# Patient Record
Sex: Female | Born: 1950 | Race: White | Hispanic: No | Smoking: Former smoker
Health system: Southern US, Community
[De-identification: ages and names within clinical notes are randomized; demographics above are authoritative.]

## PROBLEM LIST (undated history)

## (undated) DIAGNOSIS — K7581 Nonalcoholic steatohepatitis (NASH): Secondary | ICD-10-CM

## (undated) DIAGNOSIS — E079 Disorder of thyroid, unspecified: Secondary | ICD-10-CM

## (undated) DIAGNOSIS — K7460 Unspecified cirrhosis of liver: Secondary | ICD-10-CM

## (undated) DIAGNOSIS — C50919 Malignant neoplasm of unspecified site of unspecified female breast: Secondary | ICD-10-CM

## (undated) DIAGNOSIS — K760 Fatty (change of) liver, not elsewhere classified: Secondary | ICD-10-CM

## (undated) DIAGNOSIS — I8500 Esophageal varices without bleeding: Secondary | ICD-10-CM

## (undated) DIAGNOSIS — I10 Essential (primary) hypertension: Secondary | ICD-10-CM

## (undated) DIAGNOSIS — E039 Hypothyroidism, unspecified: Secondary | ICD-10-CM

## (undated) DIAGNOSIS — F419 Anxiety disorder, unspecified: Secondary | ICD-10-CM

## (undated) DIAGNOSIS — E7800 Pure hypercholesterolemia, unspecified: Secondary | ICD-10-CM

## (undated) HISTORY — DX: Essential (primary) hypertension: I10

## (undated) HISTORY — DX: Disorder of thyroid, unspecified: E07.9

## (undated) HISTORY — DX: Nonalcoholic steatohepatitis (NASH): K75.81

## (undated) HISTORY — PX: CATARACT EXTRACTION: SUR2

## (undated) HISTORY — DX: Unspecified cirrhosis of liver: K74.60

## (undated) HISTORY — PX: ESOPHAGOGASTRODUODENOSCOPY (EGD) WITH ESOPHAGEAL DILATION: SHX5812

## (undated) HISTORY — DX: Esophageal varices without bleeding: I85.00

## (undated) HISTORY — DX: Anxiety disorder, unspecified: F41.9

## (undated) HISTORY — PX: APPENDECTOMY: SHX54

## (undated) HISTORY — DX: Pure hypercholesterolemia, unspecified: E78.00

## (undated) HISTORY — PX: COLONOSCOPY: SHX174

## (undated) HISTORY — PX: BREAST LUMPECTOMY: SHX2

## (undated) HISTORY — DX: Malignant neoplasm of unspecified site of unspecified female breast: C50.919

---

## 2002-12-13 DIAGNOSIS — C50919 Malignant neoplasm of unspecified site of unspecified female breast: Secondary | ICD-10-CM

## 2002-12-13 HISTORY — PX: AUGMENTATION MAMMAPLASTY: SUR837

## 2002-12-13 HISTORY — PX: ABDOMINOPLASTY: SUR9

## 2002-12-13 HISTORY — DX: Malignant neoplasm of unspecified site of unspecified female breast: C50.919

## 2003-05-31 ENCOUNTER — Encounter (INDEPENDENT_AMBULATORY_CARE_PROVIDER_SITE_OTHER): Payer: Self-pay | Admitting: *Deleted

## 2003-05-31 ENCOUNTER — Encounter: Admission: RE | Admit: 2003-05-31 | Discharge: 2003-05-31 | Payer: Self-pay | Admitting: General Surgery

## 2003-05-31 ENCOUNTER — Encounter: Payer: Self-pay | Admitting: General Surgery

## 2003-05-31 ENCOUNTER — Other Ambulatory Visit: Admission: RE | Admit: 2003-05-31 | Discharge: 2003-05-31 | Payer: Self-pay | Admitting: Diagnostic Radiology

## 2003-06-05 ENCOUNTER — Ambulatory Visit (HOSPITAL_COMMUNITY): Admission: RE | Admit: 2003-06-05 | Discharge: 2003-06-05 | Payer: Self-pay | Admitting: General Surgery

## 2003-06-05 ENCOUNTER — Encounter: Payer: Self-pay | Admitting: General Surgery

## 2003-06-06 ENCOUNTER — Encounter: Payer: Self-pay | Admitting: General Surgery

## 2003-07-01 ENCOUNTER — Encounter: Payer: Self-pay | Admitting: General Surgery

## 2003-07-01 ENCOUNTER — Ambulatory Visit (HOSPITAL_BASED_OUTPATIENT_CLINIC_OR_DEPARTMENT_OTHER): Admission: RE | Admit: 2003-07-01 | Discharge: 2003-07-01 | Payer: Self-pay | Admitting: General Surgery

## 2003-07-01 ENCOUNTER — Encounter (INDEPENDENT_AMBULATORY_CARE_PROVIDER_SITE_OTHER): Payer: Self-pay | Admitting: *Deleted

## 2003-07-15 ENCOUNTER — Encounter (INDEPENDENT_AMBULATORY_CARE_PROVIDER_SITE_OTHER): Payer: Self-pay | Admitting: *Deleted

## 2003-07-15 ENCOUNTER — Ambulatory Visit (HOSPITAL_BASED_OUTPATIENT_CLINIC_OR_DEPARTMENT_OTHER): Admission: RE | Admit: 2003-07-15 | Discharge: 2003-07-15 | Payer: Self-pay | Admitting: General Surgery

## 2003-08-13 ENCOUNTER — Encounter: Payer: Self-pay | Admitting: General Surgery

## 2003-08-13 ENCOUNTER — Encounter (INDEPENDENT_AMBULATORY_CARE_PROVIDER_SITE_OTHER): Payer: Self-pay | Admitting: Specialist

## 2003-08-13 ENCOUNTER — Inpatient Hospital Stay (HOSPITAL_COMMUNITY): Admission: RE | Admit: 2003-08-13 | Discharge: 2003-08-15 | Payer: Self-pay | Admitting: General Surgery

## 2003-10-30 ENCOUNTER — Ambulatory Visit (HOSPITAL_BASED_OUTPATIENT_CLINIC_OR_DEPARTMENT_OTHER): Admission: RE | Admit: 2003-10-30 | Discharge: 2003-10-30 | Payer: Self-pay | Admitting: Plastic Surgery

## 2004-07-10 ENCOUNTER — Encounter (INDEPENDENT_AMBULATORY_CARE_PROVIDER_SITE_OTHER): Payer: Self-pay | Admitting: Specialist

## 2004-07-10 ENCOUNTER — Ambulatory Visit (HOSPITAL_COMMUNITY): Admission: RE | Admit: 2004-07-10 | Discharge: 2004-07-10 | Payer: Self-pay | Admitting: *Deleted

## 2004-09-08 ENCOUNTER — Other Ambulatory Visit: Admission: RE | Admit: 2004-09-08 | Discharge: 2004-09-08 | Payer: Self-pay | Admitting: Obstetrics and Gynecology

## 2004-12-03 ENCOUNTER — Ambulatory Visit (HOSPITAL_BASED_OUTPATIENT_CLINIC_OR_DEPARTMENT_OTHER): Admission: RE | Admit: 2004-12-03 | Discharge: 2004-12-03 | Payer: Self-pay | Admitting: Plastic Surgery

## 2004-12-03 ENCOUNTER — Ambulatory Visit (HOSPITAL_COMMUNITY): Admission: RE | Admit: 2004-12-03 | Discharge: 2004-12-03 | Payer: Self-pay | Admitting: Plastic Surgery

## 2005-03-08 ENCOUNTER — Ambulatory Visit: Payer: Self-pay | Admitting: Oncology

## 2005-06-22 ENCOUNTER — Encounter: Admission: RE | Admit: 2005-06-22 | Discharge: 2005-06-22 | Payer: Self-pay | Admitting: Oncology

## 2005-06-23 ENCOUNTER — Ambulatory Visit (HOSPITAL_COMMUNITY): Admission: RE | Admit: 2005-06-23 | Discharge: 2005-06-23 | Payer: Self-pay | Admitting: Oncology

## 2005-10-28 ENCOUNTER — Other Ambulatory Visit: Admission: RE | Admit: 2005-10-28 | Discharge: 2005-10-28 | Payer: Self-pay | Admitting: Obstetrics and Gynecology

## 2005-11-02 ENCOUNTER — Encounter: Admission: RE | Admit: 2005-11-02 | Discharge: 2005-11-02 | Payer: Self-pay | Admitting: Obstetrics and Gynecology

## 2006-03-04 ENCOUNTER — Ambulatory Visit: Payer: Self-pay | Admitting: Oncology

## 2006-06-23 ENCOUNTER — Encounter: Admission: RE | Admit: 2006-06-23 | Discharge: 2006-06-23 | Payer: Self-pay | Admitting: Oncology

## 2006-08-17 ENCOUNTER — Encounter (INDEPENDENT_AMBULATORY_CARE_PROVIDER_SITE_OTHER): Payer: Self-pay | Admitting: Specialist

## 2006-08-17 ENCOUNTER — Ambulatory Visit (HOSPITAL_BASED_OUTPATIENT_CLINIC_OR_DEPARTMENT_OTHER): Admission: RE | Admit: 2006-08-17 | Discharge: 2006-08-17 | Payer: Self-pay | Admitting: Plastic Surgery

## 2007-01-09 ENCOUNTER — Other Ambulatory Visit: Admission: RE | Admit: 2007-01-09 | Discharge: 2007-01-09 | Payer: Self-pay | Admitting: Obstetrics and Gynecology

## 2007-03-08 ENCOUNTER — Ambulatory Visit: Payer: Self-pay | Admitting: Oncology

## 2007-03-13 LAB — CBC WITH DIFFERENTIAL/PLATELET
BASO%: 0.1 % (ref 0.0–2.0)
Basophils Absolute: 0 10*3/uL (ref 0.0–0.1)
EOS%: 1.5 % (ref 0.0–7.0)
Eosinophils Absolute: 0.1 10*3/uL (ref 0.0–0.5)
HCT: 36.3 % (ref 34.8–46.6)
HGB: 12.8 g/dL (ref 11.6–15.9)
LYMPH%: 33.6 % (ref 14.0–48.0)
MCH: 32.9 pg (ref 26.0–34.0)
MCHC: 35.3 g/dL (ref 32.0–36.0)
MCV: 93.4 fL (ref 81.0–101.0)
MONO#: 0.3 10*3/uL (ref 0.1–0.9)
MONO%: 4.3 % (ref 0.0–13.0)
NEUT#: 3.5 10*3/uL (ref 1.5–6.5)
NEUT%: 60.5 % (ref 39.6–76.8)
Platelets: 244 10*3/uL (ref 145–400)
RBC: 3.89 10*6/uL (ref 3.70–5.32)
RDW: 13.4 % (ref 11.3–14.5)
WBC: 5.8 10*3/uL (ref 3.9–10.0)
lymph#: 1.9 10*3/uL (ref 0.9–3.3)

## 2007-03-13 LAB — COMPREHENSIVE METABOLIC PANEL
ALT: 85 U/L — ABNORMAL HIGH (ref 0–35)
AST: 142 U/L — ABNORMAL HIGH (ref 0–37)
Albumin: 4.5 g/dL (ref 3.5–5.2)
Alkaline Phosphatase: 122 U/L — ABNORMAL HIGH (ref 39–117)
BUN: 10 mg/dL (ref 6–23)
CO2: 23 mEq/L (ref 19–32)
Calcium: 10.2 mg/dL (ref 8.4–10.5)
Chloride: 103 mEq/L (ref 96–112)
Creatinine, Ser: 0.76 mg/dL (ref 0.40–1.20)
Glucose, Bld: 91 mg/dL (ref 70–99)
Potassium: 4 mEq/L (ref 3.5–5.3)
Sodium: 140 mEq/L (ref 135–145)
Total Bilirubin: 0.4 mg/dL (ref 0.3–1.2)
Total Protein: 8.5 g/dL — ABNORMAL HIGH (ref 6.0–8.3)

## 2007-03-13 LAB — CANCER ANTIGEN 27.29: CA 27.29: 26 U/mL (ref 0–39)

## 2007-03-24 ENCOUNTER — Encounter: Admission: RE | Admit: 2007-03-24 | Discharge: 2007-03-24 | Payer: Self-pay | Admitting: Gastroenterology

## 2007-04-07 ENCOUNTER — Encounter (INDEPENDENT_AMBULATORY_CARE_PROVIDER_SITE_OTHER): Payer: Self-pay | Admitting: Specialist

## 2007-04-07 ENCOUNTER — Ambulatory Visit (HOSPITAL_BASED_OUTPATIENT_CLINIC_OR_DEPARTMENT_OTHER): Admission: RE | Admit: 2007-04-07 | Discharge: 2007-04-07 | Payer: Self-pay | Admitting: Obstetrics & Gynecology

## 2007-04-28 ENCOUNTER — Encounter (INDEPENDENT_AMBULATORY_CARE_PROVIDER_SITE_OTHER): Payer: Self-pay | Admitting: Diagnostic Radiology

## 2007-04-28 ENCOUNTER — Ambulatory Visit (HOSPITAL_COMMUNITY): Admission: RE | Admit: 2007-04-28 | Discharge: 2007-04-28 | Payer: Self-pay | Admitting: Gastroenterology

## 2007-07-03 ENCOUNTER — Encounter: Admission: RE | Admit: 2007-07-03 | Discharge: 2007-07-03 | Payer: Self-pay | Admitting: Oncology

## 2008-02-21 ENCOUNTER — Other Ambulatory Visit: Admission: RE | Admit: 2008-02-21 | Discharge: 2008-02-21 | Payer: Self-pay | Admitting: Obstetrics and Gynecology

## 2008-03-07 ENCOUNTER — Ambulatory Visit: Payer: Self-pay | Admitting: Oncology

## 2008-03-11 LAB — CBC WITH DIFFERENTIAL/PLATELET
BASO%: 0.8 % (ref 0.0–2.0)
Basophils Absolute: 0.1 10*3/uL (ref 0.0–0.1)
EOS%: 0.4 % (ref 0.0–7.0)
Eosinophils Absolute: 0 10*3/uL (ref 0.0–0.5)
HCT: 35.3 % (ref 34.8–46.6)
HGB: 12.5 g/dL (ref 11.6–15.9)
LYMPH%: 25.4 % (ref 14.0–48.0)
MCH: 32.4 pg (ref 26.0–34.0)
MCHC: 35.5 g/dL (ref 32.0–36.0)
MCV: 91.3 fL (ref 81.0–101.0)
MONO#: 0.3 10*3/uL (ref 0.1–0.9)
MONO%: 3.7 % (ref 0.0–13.0)
NEUT#: 5.2 10*3/uL (ref 1.5–6.5)
NEUT%: 69.7 % (ref 39.6–76.8)
Platelets: 233 10*3/uL (ref 145–400)
RBC: 3.86 10*6/uL (ref 3.70–5.32)
RDW: 12.9 % (ref 11.3–14.5)
WBC: 7.4 10*3/uL (ref 3.9–10.0)
lymph#: 1.9 10*3/uL (ref 0.9–3.3)

## 2008-03-13 LAB — IMMUNOFIXATION ELECTROPHORESIS
IgA: 318 mg/dL (ref 68–378)
IgG (Immunoglobin G), Serum: 1990 mg/dL — ABNORMAL HIGH (ref 694–1618)
IgM, Serum: 239 mg/dL (ref 60–263)
Total Protein, Serum Electrophoresis: 8.4 g/dL — ABNORMAL HIGH (ref 6.0–8.3)

## 2008-03-13 LAB — COMPREHENSIVE METABOLIC PANEL
ALT: 69 U/L — ABNORMAL HIGH (ref 0–35)
AST: 94 U/L — ABNORMAL HIGH (ref 0–37)
Albumin: 4.7 g/dL (ref 3.5–5.2)
Alkaline Phosphatase: 116 U/L (ref 39–117)
BUN: 11 mg/dL (ref 6–23)
CO2: 25 mEq/L (ref 19–32)
Calcium: 10 mg/dL (ref 8.4–10.5)
Chloride: 101 mEq/L (ref 96–112)
Creatinine, Ser: 0.82 mg/dL (ref 0.40–1.20)
Glucose, Bld: 102 mg/dL — ABNORMAL HIGH (ref 70–99)
Potassium: 4 mEq/L (ref 3.5–5.3)
Sodium: 138 mEq/L (ref 135–145)
Total Bilirubin: 0.6 mg/dL (ref 0.3–1.2)
Total Protein: 8.4 g/dL — ABNORMAL HIGH (ref 6.0–8.3)

## 2008-03-13 LAB — FOLLICLE STIMULATING HORMONE: FSH: 15.2 m[IU]/mL

## 2008-03-13 LAB — CANCER ANTIGEN 27.29: CA 27.29: 20 U/mL (ref 0–39)

## 2008-03-25 LAB — ESTRADIOL, ULTRA SENS: Estradiol, Ultra Sensitive: 10 pg/mL

## 2008-07-03 ENCOUNTER — Encounter: Admission: RE | Admit: 2008-07-03 | Discharge: 2008-07-03 | Payer: Self-pay | Admitting: Oncology

## 2008-07-19 ENCOUNTER — Ambulatory Visit: Payer: Self-pay | Admitting: Oncology

## 2010-12-13 HISTORY — PX: MASTECTOMY, PARTIAL: SHX709

## 2011-01-03 ENCOUNTER — Encounter: Payer: Self-pay | Admitting: Oncology

## 2011-04-30 NOTE — Op Note (Signed)
   NAME:  Joyce Griffin, Joyce Griffin                            ACCOUNT NO.:  [id number]   MEDICAL RECORD NO.:  [id number]                   PATIENT TYPE:  AMB   LOCATION:  DSC                                  FACILITY:  [facility]   PHYSICIAN:  Rose Phi. Maple Hudson, M.D.                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   OPERATION:  Re-excision of previous lumpectomy site.   SURGEON:  Rose Phi. Maple Hudson, M.D.   ANESTHESIA:  General.   OPERATIVE PROCEDURE:  The patient had previously undergone a left partial  mastectomy and sentinel node biopsy for a T1, N0 lesion.  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  For that reason we  elected to re-excise this.   After suitable general anesthesia was induced, the patient was placed in a  supine position with the left arm extended on the arm board and the breast  prepped and draped in the usual fashion.  I then opened the previous  incision and entered the lumpectomy cavity.   After evacuating it, I then excised circumferentially beginning on the  inferior margin at about the 4 o'clock position and excised the margin  circumferentially, going from lateral to medial, and then the superior  margin to about 2 o'clock.  That way we excised the superior, medial, and  inferior margins.  These were then oriented for the pathologist and  methylene blue was placed on the outside margin.  We had good hemostasis.  After irrigating it thoroughly with bug juice, I closed the incision in two  layers with 3-0 Vicryl and subcuticular 4-0 Monocryl and Steri-Strips.  A  dressing was applied.  Patient transferred to the recovery room in  satisfactory condition, having tolerated the procedure  well.                                               Rose Phi. Maple Hudson, M.D.    PRY/MEDQ  D:  07/15/2003  T:  07/15/2003  Job:  [id number]

## 2011-04-30 NOTE — Discharge Summary (Signed)
NAME:  Joyce Griffin, Joyce Griffin                            ACCOUNT NO.:  [id number]   MEDICAL RECORD NO.:  [id number]                   PATIENT TYPE:  INP   LOCATION:  5710                                 FACILITY:  [facility]   PHYSICIAN:  Alfredia Ferguson, M.D.               DATE OF BIRTH:  [birth year]   DATE OF ADMISSION:  08/13/2003  DATE OF DISCHARGE:  08/15/2003                                 DISCHARGE SUMMARY   ADMISSION DIAGNOSIS:  Left breast cancer.   DISCHARGE DIAGNOSIS:  Left breast cancer.   OPERATION PERFORMED:  1. Left mastectomy.  2. Immediate breast reconstruction with left transverse rectus abdominis     myocutaneous flap.   CHIEF COMPLAINT:  I have breast cancer.   HISTORY OF PRESENT ILLNESS:  This is a 59 year old woman with a recent  diagnosis of breast cancer.  She was initially treated with lumpectomy and  sentinel node biopsy.  The sentinel node was negative.  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  The patient was advised  that she will require mastectomy due to the extensive nature of the disease.  She has opted to undergo mastectomy which she is to undergo immediate breast  reconstruction with a TRAM flap.  She was counseled as an outpatient  regarding potential risks of the TRAM procedure.   PAST MEDICAL HISTORY:  Negative.   PAST SURGICAL HISTORY:  Significant for recent lumpectomy and sentinel node  biopsy.   MEDICATIONS:  Multivitamins and Benadryl as needed.   ALLERGIES:  CODEINE.   PHYSICAL EXAMINATION:  Please see admission H&P for complete physical exam.   LABORATORY DATA:  Admission laboratory values included a hemoglobin of 13.8,  hematocrit of 39.7.  She had normal electrolytes.  Her blood sugar is 107.  She had slightly liver functions with AST of 85, ALT of 49, and ALP of 150.  Chest x-ray was completely with no evidence of disease.  EKG normal.   HOSPITAL COURSE:  On the day of admission, the patient was taken to  the  operating room where she underwent a left mastectomy followed by ipsilateral  left transverse rectus abdominis myocutaneous flap.  Postoperative course  was completely uneventful.  She was started on a diet the night of surgery  which was advanced to a regular diet the first postoperative day.  The  patient was ambulating the first postoperative day.  Dressings were removed  on the second postoperative day.  Flap was completely viable, soft with  excellent symmetry.  The abdominal wound was healing nicely although there  is some evidence of epidermolysis in the mid portion of the abdominal  incision just below the umbilicus.  There is also slight venous congestion  of her umbilicus.  The remainder of the incision looks fine.  Drainage is  minimal.  The patient was given her instructions for emptying and recording  the  drain output.  Postoperative medications were given to the patient which  included Vicodin and Keflex.  Follow up will be  provided in one week in my office for removal of the drains.  The patient  understands how to get in touch with me if there any questions or concerns.  She understands her postoperative instructions which were provided to her in  the preoperative setting.                                                Alfredia Ferguson, M.D.    WBB/MEDQ  D:  08/15/2003  T:  08/15/2003  Job:  [id number]

## 2011-04-30 NOTE — Op Note (Signed)
   NAME:  Joyce Griffin, Joyce Griffin                            ACCOUNT NO.:  [id number]   MEDICAL RECORD NO.:  [id number]                   PATIENT TYPE:  INP   LOCATION:  2899                                 FACILITY:  [facility]   PHYSICIAN:  Rose Phi. Maple Hudson, M.D.                DATE OF BIRTH:  08/21/1951   DATE OF PROCEDURE:  08/13/2003  DATE OF DISCHARGE:                                 OPERATIVE REPORT   PREOPERATIVE DIAGNOSIS:  T1C, N0 carcinoma of the left breast with extensive  ductal carcinoma in situ.   POSTOPERATIVE DIAGNOSIS:  T1C, N0 carcinoma of the left breast with  extensive ductal carcinoma in situ.   OPERATION:  Left total mastectomy to be followed by TRAM reconstruction.   SURGEON:  Rose Phi. Maple Hudson, M.D.   ASSISTANT:  Anselm Pancoast. Zachery Dakins, M.D.   ANESTHESIA:  General.   DESCRIPTION OF PROCEDURE:  After suitable general endotracheal anesthesia  was induced the patient was placed in the supine position with both arms  extended on the arm board. She was then prepped from the pubis to the neck  and  from the tableside to tableside. She had been previously marked by Dr.  Benna Dunks for the TRAM reconstruction and an elliptical incision was then  outlined of the left breast incorporating the lumpectomy scar at the 12  o'clock position.   The incision was made and then we dissected a superior flap to near the  clavicle, then  medially to near the sternum and inferiorly to the rectus  fascia and laterally to the latissimus dorsi muscle. The breast was then  removed by dissecting from medial to lateral, incorporating the pectoralis  fascia in order to get under the seroma. Hemostasis was achieved with  cautery.   We thoroughly irrigated  the field with saline. Dr. Benna Dunks then came in to  do the TRAM reconstruction which will be dictated in a separate note.                                               Rose Phi. Maple Hudson, M.D.    PRY/MEDQ  D:  08/13/2003  T:  08/13/2003  Job:  [id number]   cc:   Valentino Hue. Magrinat, M.D.  [address] [facility]  [place]  [place] [address]  Fax: [phone]

## 2011-04-30 NOTE — Op Note (Signed)
   NAME:  Joyce Griffin, Joyce Griffin                            ACCOUNT NO.:  [id number]   MEDICAL RECORD NO.:  [id number]                   PATIENT TYPE:  AMB   LOCATION:  DSC                                  FACILITY:  [facility]   PHYSICIAN:  Rose Phi. Maple Hudson, M.D.                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   OPERATION PERFORMED:  1. Blue dye injection.  2. Left partial mastectomy.  3. Left sentinel lymph node biopsy.   SURGEON:  Rose Phi. Maple Hudson, M.D.   ANESTHESIA:  General.   DESCRIPTION OF PROCEDURE:  Prior to coming to the operating room, 1 mCi of  technetium sulfur colloid was injected intradermally.  After suitable  general anesthesia was induced, the patient was placed in supine position  with the left arm extended on the arm board.  A mixture of 2mL of methylene  blue and 3mL of injectable saline was injected in the subareolar tissue and  the breast gently massaged for about 3 minutes.   After prepping and draping, we carefully scanned the internal mammary and  the supraclavicular areas and the only hot spot was in the left axilla.  A  short transverse axillary incision was made with dissection through the  subcutaneous tissue to the clavipectoral fascia.  Just beneath the fascia  were two enlarged hot and blue lymph nodes which were removed as sentinel  nodes.  There were no other palpable blue or hot nodes. These were submitted  to the pathologist for Touch Preps.   While that was being done, the palpable tumor in the upper inner quadrant of  her left breast was outlined with a marking pencil and a curved incision  incorporating a wedge of skin was then made and a wide excision was carried  out.  Specimen oriented for the pathologist.   Touch Preps of both  the sentinel node and the margins were clean.  With good  hemostasis the incisions were closed with 3-0 Vicryl and subcuticular 4-0  Monocryl and Steri-Strips.  Dressing was applied.  The patient was then  transferred to the recovery room in satisfactory condition having tolerated  the procedure well.                                               Rose Phi. Maple Hudson, M.D.    PRY/MEDQ  D:  07/01/2003  T:  07/01/2003  Job:  [id number]

## 2011-04-30 NOTE — Op Note (Signed)
Joyce Griffin, LOVINS                  ACCOUNT NO.:  [id number]   MEDICAL RECORD NO.:  [id number]          PATIENT TYPE:  AMB   LOCATION:  DSC                          FACILITY:  [facility]   PHYSICIAN:  Alfredia Ferguson, M.D.  DATE OF BIRTH:  04-08-1951   DATE OF PROCEDURE:  12/03/2004  DATE OF DISCHARGE:                                 OPERATIVE REPORT   PREOPERATIVE DIAGNOSES:  1.  Acquired asymmetry bilateral breasts.  2.  History of breast cancer.  3.  History of left breast reconstruction.   POSTOPERATIVE DIAGNOSES:  1.  Acquired asymmetry bilateral breasts.  2.  History of breast cancer.  3.  History of left breast reconstruction.   OPERATION PERFORMED:  Bilateral differential augmentation mammoplasty to  create breast symmetry.  225 mL smooth shell Mentor saline implant used on  the left, inflated to a total volume of 250 mL.  275 mL smooth shell saline  implant inflated to 300 mL used in the right breast.   SURGEON:  W. Delia Chimes, M.D.   ANESTHESIA:  General endotracheal.   INDICATIONS FOR PROCEDURE:  The patient is a 60 year old woman who is status  post left mastectomy followed by immediate breast reconstruction with  transverse rectus abdominis myocutaneous flap.  Her TRAM side is larger than  her natural side.  Because of the difference is not great, it will be almost  impossible to match her up simply by putting an implant in the right side.  For that reason, the plan is to differentially augment her putting a larger  implant in the right side than the left side.  The patient understands the  risks of surgery including being made too large, not being made large  enough, bleeding, infection, hematoma, seroma, residual asymmetry, capsular  contracture, rippling of the implant, uncertain life span of the implant,  the need to replace the implant of multiple occasions over her lifetime, the  need for secondary surgery and overall dissatisfaction with the results.  In  spite of these and other risks, the patient wishes to proceed with the  surgery.   DESCRIPTION OF PROCEDURE:  Skin markers were placed to outline the desired  dimensions of both breasts.  The patient was taken to the operating room  where she was given general endotracheal anesthesia.  Attention was first  directed to the left side which was the side that the TRAM flap existed.  I  opted to make my incision in the inframammary crease, rather than through  the TRAM skin paddle scar.  A 3 cm inframammary crease incision was made  lateral to the border of the rectus muscle.  The incision was deepened until  reaching the pectoralis fascia.  The fascia was opened and a subpectoral  space was entered.  Using a combination of blunt and electrocautery  dissection, a pocket of adequate size to accommodate a 225 mL implant was  created.  Hemostasis was meticulously maintained.  10 mL of Marcaine was  injected in the lateral axillary pocket.  The pocket was copiously irrigated  with saline irrigation.  A Mentor smooth shell saline implant was prepared  by evacuating the air and placing 100 mL of saline.  The pocket was once  again inspected and once hemostasis had been assured, the implant was placed  in the desired position and inflated to a total volume of 250 mL.  The fill  tubing was removed from the fill port and the cap over the fill port was  pushed down into the opening.  3-0 Vicryl sutures were placed in the deep  breast tissue to close off the breast implant pocket.  The incision was  irrigated with warm saline irrigation.  The incision was closed by  approximating the dermis using interrupted 4-0 Monocryl sutures.  Attention  was directed to the right side which was the native breast.  Again, the  approach was the same through the inframammary crease incision and a  subpectoral space was created.  The right side was smaller than the left  side so I opted to place a 275 mL implant.  The  implant was prepared in  identical fashion and placed in the subpectoral pocket and inflated to a  total volume of 300 mL.  Fill tubing was removed.  The wound was closed in  an identical fashion on the left side.  There was still some asymmetry with  the left side appearing slightly larger than the right side but it was close  enough.  Part of the problem was the appearance after the placement of the  implant where the left side had more projection than the right side.  This  likely will relax as the pectoralis muscle on the right side relaxes a bit  more.  The patient's chest was cleansed and dried and Steri-Strips placed  over the incision followed by a bulky dressing and a circumferential Ace  wrap. The patient was awakened and extubated and transported to the recovery  room in satisfactory condition.  The estimated blood loss for this procedure  was minimal.      W. B   WBB/MEDQ  D:  12/03/2004  T:  12/04/2004  Job:  [id number]

## 2011-04-30 NOTE — Op Note (Signed)
NAME:  Joyce Griffin, Joyce Griffin                            ACCOUNT NO.:  [id number]   MEDICAL RECORD NO.:  [id number]                   PATIENT TYPE:  AMB   LOCATION:  DSC                                  FACILITY:  [facility]   PHYSICIAN:  Alfredia Ferguson, M.D.               DATE OF BIRTH:  02/11/1951   DATE OF PROCEDURE:  10/30/2003  DATE OF DISCHARGE:                                 OPERATIVE REPORT   PREOPERATIVE DIAGNOSIS:  1. Left breast cancer.  2. Acquired absence left breast.   POSTOPERATIVE DIAGNOSIS:  1. Left breast cancer.  2. Acquired absence left breast.   OPERATION PERFORMED:  Left nipple reconstruction using tripartate flap  reconstruction.   SURGEON:  Alfredia Ferguson, M.D.   ANESTHESIA:  None required.   INDICATIONS FOR PROCEDURE:  This is a 60 year old woman who is status post  left mastectomy with reconstruction using a TRAM flap.  She is now ready for  nipple reconstruction.  She understands the risks of nipple reconstruction  including vascular compromise to the flap with failure of the flap.  She  understands it is going to shrink significantly over the coming month and  that she may end up with some nipple asymmetry.  In spite of these risks,  the patient wishes to proceed with the surgery.   DESCRIPTION OF PROCEDURE:  With the patient in the sitting position, the  location of the nipple was chosen relative to the opposite nipple.  The  patient was then placed in a supine position.  A 42 mm diameter circle was  drawn around the located nipple. The left reconstructed breast was prepped  with Betadine.  The area was draped with sterile drapes.  Within the  confines of this circle, a tripartate flap was designed with three points of  the flap pointing to the 3 o'clock, 9 o'clock, and  12 o'clock position.  A  2.5 cm wide skin paddle was left inferiorly to act as the vascular supply  for the flap.  The flap was incised and elevated at the level of the  subcutaneous  tissue.  The tips of each of the flap was removed to create  blunt ends.  The donor sites were closed by reapproximating the dermis of  triangular incision with a 4-0 PDS suture.  The skin edges were reunited at  the donor site using a running 4-0 chromic suture.  The two flaps pointed to  the 3 o'clock and 9 o'clock position were rotated towards one another and  the blunt ends were affixed to one another using interrupted 4-0 chromic.  The flap at the 12 o'clock position was then placed down on top of the  cylinder and fixed in position with 4-0 chromic suture.  The vascularity of  the nipple appeared to be good at the conclusion of the procedure.  The  patient tolerated the procedure well.  The breast was dressed with a bulky  dressing and the patient was discharged to home in satisfactory condition.                                               Alfredia Ferguson, M.D.    WBB/MEDQ  D:  10/30/2003  T:  10/30/2003  Job:  [id number]

## 2011-04-30 NOTE — Op Note (Signed)
NAME:  Joyce Griffin, Joyce Griffin                            ACCOUNT NO.:  [id number]   MEDICAL RECORD NO.:  [id number]                   PATIENT TYPE:  INP   LOCATION:  2550                                 FACILITY:  [facility]   PHYSICIAN:  Alfredia Ferguson, M.D.               DATE OF BIRTH:  Aug 02, 1951   DATE OF PROCEDURE:  08/13/2003  DATE OF DISCHARGE:                                 OPERATIVE REPORT   PREOPERATIVE DIAGNOSES:  1. Left breast cancer.  2. Acquired absence of left breast.   POSTOPERATIVE DIAGNOSES:  1. Left breast cancer.  2. Acquired absence of left breast.   OPERATION PERFORMED:  Immediate breast reconstruction with left ipsilateral  transverse rectus abdominis myocutaneous flap.   SURGEON:  Alfredia Ferguson, M.D.   ASSISTANT: Jacky Kindle, RNFA   ANESTHESIA:  General endotracheal anesthesia.   INDICATION FOR SURGERY:  This is a 60 year old woman with a recent diagnosis  of left breast cancer.  She is undergoing a mastectomy and wishes to have  immediate breast reconstruction with a TRAM flap.  She has been counseled in  the operating room regarding her options for reconstruction.  She has chosen  TRAM flap.  The potential risks of the surgery, including asymmetry,  vascular compromise to the TRAM with partial or complete loss of the flap,  bleeding, hematoma, seroma, the need for transfusion, wound healing  difficulties, unsightly scarring, abdominal hernias, protracted drainage,  infection of the Marlex mesh in the abdomen, the need for secondary surgery  to correct any asymmetries, and overall dissatisfaction with the results.  In spite of these potential risks, the patient wishes to proceed with the  operation.   DESCRIPTION OF SURGERY:  On the day prior to surgery skin marks were placed  to outline the dimensions of the TRAM flap.  The marks were re-darkened  prior to being taken to the operating room this morning.  Upon the  completion of the mastectomy by Dr.  Maple Hudson, I was summoned to the operating  room.  Attention was first directed to the recipient site.  Hemostasis was  assured using electrocautery.  A 10 mm Blake drain was placed in the  axillary gutter and brought out through a separate stab incision.  A tunnel  was begun in the inferior medial aspect of the mastectomy defect over the  rectus fascia and dissected for approximately 8-9 cm inferiorly.  A circular  incision was now made around the umbilicus, and the umbilicus was dissected  from the surrounding abdominal flap, leaving a healthy cuff of fat to ensure  vascular integrity.  The upper portion of the skin mark was placed in the  lower abdomen and carried through the skin and subcutaneous tissue.  The  upper abdominal flap was elevated to the costal margins bilaterally and the  xiphoid in the midline.  This dissection connected with the previously-  placed  tunnel coming from the left mastectomy site.  The patient's back was  temporarily elevated to ensure that closure could be carried out in a  tension-free fashion.  It was felt that closure could be accomplished.  The  patient was returned to a supine position and the lower skin incision was  made down to the anterior rectus fascia.  The right lateral corner of the  skin flap was elevated off the anterior abdominal wall fascia until crossing  the linea alba 1 cm to the left of midline.  The left corner of the skin  paddle was elevated until reaching the lateral perforators of the rectus  muscle, which was approximately 3 cm medial to the lateral rectus border.  Two parallel incisions were made in the rectus fascia 2 cm apart beginning  at the costal margin superiorly.  The two incisions were carried inferiorly  until hitting the connection of the skin paddle to the anterior rectus  fascia.  The medial rectus fascia incision continued inferiorly, skirting  along the medial connection of the skin paddle to the fascia, then turning   laterally once I reached the inferior connection of the skin paddle to the  fascia.  The lateral rectus fascial incision was continued inferiorly,  skirting along the lateral connection of the skin paddle to the rectus  fascia until connecting with the previously-placed incision in the rectus  fascia.  The medial and lateral edges of the rectus fascia were now  dissected off the medial and lateral rectus muscle with great care to  preserve the tendinous inscription architecture.  The dep surface of the  rectus muscle was dissected out of the anatomic bed using bipolar  dissection.  The deep inferior epigastric vessels were visualized inferiorly  and laterally.  The vessels were dissected out of their anatomic bed and the  artery was clipped between double hemoclips and then divided.  The two veins  were then double-clipped and divided.  The muscle was divided just below the  arcuate line.  This completely freed the skin paddle and the muscle.  I  opted to remove approximately 10 cm of the right lateral aspect of the skin  paddle due to mottling secondary to venous congestion.  Once this had been  removed, the entire skin paddle and the muscle were passed through the  tunnel up to the recipient site and temporarily stapled in position.  The  abdominal wound was copiously irrigated with saline irrigation and  hemostasis was assured.  The anterior rectus fascia was closed using  multiple interrupted buried figure-of-eight 0 Prolene sutures.  Marlex mesh  6 x 6 inches was placed over the entire rectus fascia closure as an onlay  reinforcement mesh.  It was fixed by sewing it along the periphery using a  running 2-0 Prolene suture.  An opening was made in the Marlex mesh, and the  umbilicus was brought through the opening.  The wound was again irrigated  with saline irrigation and inspected carefully for hemostasis.  Once assured, the patient was placed in a semi-Fowler position with the back   elevated to 30 degrees and the knees flexed.  Closure was begun by  approximating the abdominal skin incision to the lower incision using  interrupted 2-0 Vicryl sutures.  An opening was made for the umbilicus and  the umbilicus was brought through the new opening and fixed in position  using multiple interrupted 3-0 Vicryl sutures for the dermis.  The remainder  of the  abdominal wound was closed using a combination of 2-0 and 3-0 Vicryl  sutures for the dermis.  It was noted that there was some venous congestion  between the umbilicus and the lower abdominal skin incision.  The incision  was closed under some tension and the venous congestion was noted.  Attention was now directed to the breast flap.  There remained an additional  mottling of the medial portion of the skin paddle, which was the zone 4.  I  removed an additional 2-3 cm until I got back until vigorous bright red  bleeding at the cut edge.  The TRAM flap was now temporarily put in the  position which I desired.  The skin edges were marked and the upper portion  of the skin which was beneath the superior breast flap was de-  epithelialized.  The skin paddle was now tacked in position, suspending it  from the superior margin of the mastectomy dissection.  I was unable to  elevate the TRAM flap all the way superiorly in the lateral two-thirds of  the mastectomy defect due to the small size of the skin paddle and the high  dissection point on the chest.  The upper portion of the skin paddle was  fixed to the cut edge of the breast flap using interrupted 3-0 Vicryl  sutures, followed by running 3-0 Vicryl subcuticular.  The inferior cut edge  of the skin flap was closed in a similar fashion to the inferior breast  flap.  The color of the skin paddle was excellent with great capillary  refill and no venous congestion.  Symmetry appeared to be acceptable.  Estimated blood loss throughout the dissection was 250 mL.  Steri-Strips   were applied to the skin edges.  Dressings were placed over the breasts and  the abdomen.  The patient was awakened, extubated, and transferred to a bed  with the back in a similar position as the OR table.  The patient was  transported to the recovery room in satisfactory condition.                                                Alfredia Ferguson, M.D.    WBB/MEDQ  D:  08/13/2003  T:  08/13/2003  Job:  [id number]   cc:   Rose Phi. Young, M.D.  [address], [address]  [place]  [place] [address]  Fax: [phone]

## 2011-04-30 NOTE — Op Note (Signed)
NAMEPEDRO, Joyce Griffin NO.:  [id number]   MEDICAL RECORD NO.:  [id number]          PATIENT TYPE:  AMB   LOCATION:  DSC                          FACILITY:  [facility]   PHYSICIAN:  Alfredia Ferguson, M.D.  DATE OF BIRTH:  June 25, 1951   DATE OF PROCEDURE:  08/17/2006  DATE OF DISCHARGE:                                 OPERATIVE REPORT   PREOPERATIVE DIAGNOSIS:  1. History breast cancer.  2. Acquired absence left breast.  3. Breast asymmetry following left breast reconstruction.  4. Skin hemangioma, left medial thigh and right hip.   POSTOPERATIVE DIAGNOSIS:  1. History breast cancer.  2. Acquired absence left breast.  3. Breast asymmetry following left breast reconstruction.  4. Skin hemangioma, left medial thigh and right hip.   OPERATION PERFORMED:  1. Revision left breast reconstruction with removal of existing breast      implant and replacement with new Mentor smooth round saline implant 375      mL inflated to total volume of 375 mL.  2. Excision and closure of hemangioma, left medial thigh and right hip,      each hemangioma approximately 4 mm in diameter.   SURGEON:  Alfredia Ferguson, M.D.   ANESTHESIA:  General laryngeal mask anesthesia.   INDICATIONS FOR SURGERY:  This is a 60 year old woman who is status post  left TRAM flap reconstruction for breast cancer.  The patient has breast  asymmetry with the right, the native breast, being larger than the  reconstructed breast.  She previously had an implant placed underneath her  TRAM flap but this has still not made her large enough.  She wishes to have  a new saline implant placed.  The patient has a 225 mL implant on the left  side inflated to a total volume of 250 mL.  The plan is to remove the  existing implant and replace it with a 375 mL implant.  The potential risks  of this including infection, bleeding, hematoma, seroma, capsular  contracture, rippling of the implant, malposition of the implant,  inability  to achieve symmetry, and overall dissatisfaction with results were discussed  with the patient.  The patient also has two the small 4 mm hemangiomas on  her lower extremities.  Once is on the left medial thigh and the other one  is on her right hip area.  She would like to have both of these removed.  Each of these will leave a potential scar and the patient understands that  and wishes to proceed.   DESCRIPTION OF OPERATION:  Skin marks were placed outlining the natural  dimensions of the left and right breast with the patient in a standing  position.  She was then taken to the OR where she was given general  laryngeal mask anesthesia.  The chest was prepped with Betadine and draped  with sterile drapes.  An approximately 3 cm incision was made through the  original inframammary crease incision and deepened until reaching the  capsule of the implant.  The capsule was opened and the implant was removed.  A capsulotomy was performed the entire distance around the periphery of the  pocket.  The pocket was irrigated with saline irrigation.  6 mL of 0.5%  Marcaine was infiltrated along the periphery of the capsulotomy.  A 375 mL  Mentor smooth round saline implant was prepared by evacuating the air and  partially filling the implant.  3-0 Vicryl sutures were placed in the deep  portion of my incision and left untied.  The pocket was once again irrigated  and inspected for hemostasis.  Once assured, the implant was placed in the  desired position and inflated to a total volume of 375 mL.  The fill tubing  was removed from the fill port.  The previously placed Vicryl sutures were  tied.  The incision was irrigated and the dermis was closed with interrupted  3-0 Monocryl suture followed by Steri-Strips.  The chest was cleansed and  dried and bulky dressings were placed over each breast.  Prior wrapping the  patient, the thighs were reprepped and draped where the hemangiomas were.  1%  Xylocaine with 1:100,000 epinephrine was infiltrated.  Using a 4 mm punch  biopsy instrument, each of the two hemangiomas were removed.  The resulting  opening was closed with interrupted 5-0 nylon, both for the left side and  the right hip area.  Light dressings were applied to both areas.  Estimated  blood loss for the entire procedure was less than 10 mL.  A 6-inch Ace wrap  was now placed on the chest.  The patient was awakened, extubated, and  transported to the recovery room in satisfactory addition.      Alfredia Ferguson, M.D.  Electronically Signed     WBB/MEDQ  D:  08/17/2006  T:  08/17/2006  Job:  [id number]

## 2011-04-30 NOTE — Op Note (Signed)
[name], [name]                  ACCOUNT NO.:  [id number]   MEDICAL RECORD NO.:  [id number]          PATIENT TYPE:  AMB   LOCATION:  NESC                         FACILITY:  [facility]   PHYSICIAN:  M. Leda Quail, MD  DATE OF BIRTH:  1951-03-02   DATE OF PROCEDURE:  DATE OF DISCHARGE:                               OPERATIVE REPORT   PREOPERATIVE DIAGNOSES:  9. A 60 year old G1 P1 divorced white female with a history of      endometrial cells on Pap smear.  2. Sonohysterogram showing endometrial polyp versus submucous      fibroids.  3. History of breast cancer, status post mastectomy with      reconstruction using transverse rectus abdominis muscle flap.  The      patient is on tamoxifen.   POSTOPERATIVE DIAGNOSES:  30. A 60 year old G1 P1 divorced white female with a history of      endometrial cells on Pap smear.  2. Sonohysterogram showing endometrial polyp versus submucous      fibroids.  3. History of breast cancer, status post mastectomy with      reconstruction using transverse rectus abdominis muscle flap.  The      patient is on tamoxifen.  4. Probable submucous fibroid.   PROCEDURE:  Hysteroscopy with D&C, and partial resection of the fibroid.   SURGEON:  M. Leda Quail, M.D.   ASSISTANT:  O.R. staff.   ANESTHESIA:  MAC.   SPECIMENS:  Endometrial curettings and fibroid tissue.  These were sent  to pathology.   BLOOD LOSS:  Minimal.   FLUIDS:  1000 cc of LR.   URINE OUTPUT:  The patient voided before the procedure.   COMPLICATIONS:  None.   INDICATIONS:  Ms.  Joyce Griffin is a very pleasant 60 year old divorced white  female with history of endometrial cells on Pap smear.  She is currently  on tamoxifen and does have a history of breast cancer.  She underwent  evaluation with a sonohysterogram which showed a submucous fibroid  versus endometrial polyp.  Because of her history, we discussed  hysteroscopy with resection.  Informed consent is present and on  the  chart.  The patient is willing to proceed.   PROCEDURE:  The patient was taken to the operating room.  She was placed  in the supine position.  Anesthesia was administered by the anesthesia  staff of difficulty using MAC.  Legs were then positioned in the  lithotomy position in [place] stirrups.  Perineum, inner thighs, and  vagina were prepped in normal sterile fashion.  The patient had voided  just before going to the operating room, so no I&O catheterization was  performed.  The patient was draped in normal sterile fashion.   Bivalve speculum was placed in the vagina.  Cervix was well visualized.  Anterior lip of the cervix was grasped with a single-tooth tenaculum.  The uterus sounded to about 6.5 cm.  Using [facility] dilators, the cervix  was dilated easily up to a #27.  Initially, the diagnostic scope was  obtained.  What appeared to be a submucous  fibroid was visualized.  Attempt was then made to pass the resectoscope.  There was too tight of  a fit, and the hysteroscope could not be passed.  Multiple attempts were  made to dilate the cervix up further, but I could not get the cervix to  dilate any further than a #27.  The #29 [facility] dilator just would not  pass through the cervical os.  A different type of dilator was obtained,  and again I could not dilate the cervix up any further.  Using a rough  and smooth endometrial curette, the endometrial cavity was curetted  until a rough, gritty texture was noted, and multiple passes of the  polyp forceps did remove pieces of the submucous fibroid, but in its  entirety the fibroid was not completely removed.  Again, further  attempts were made with the hysteroscope.  This was not successful at  getting the resectoscope passed through the cervical os.  We had no  smaller resectoscope that was available.  Because of this, a full  resection of the fibroid could not be completed.  At the end of the  procedure, the fibroid was smaller.  It  did not appear to be an  endometrial polyp at all, and the tissue specimens were sent to  pathology for analysis.  No bleeding was noted. At this point because I  had no further ability to dilate the cervix further, the procedure was  terminated.  All instruments were removed from the vagina.  The cervix  was without bleeding.  Of note, a paracervical block of 1% lidocaine was  placed before dilation of the cervix to help with pain management  postoperatively.  This block was placed in the cervix at the 3, 6, 9,  and 12 o'clock positions specifically; 2.5 cc of lidocaine was instilled  at each location.   Sponge, lap, needle, and instrument counts were correct x2 at the end of  the procedure.  The patient tolerated the procedure well.  She was  awakened from anesthesia and taken to the recovery room in stable  condition.      Lum Keas, MD  Electronically Signed     MSM/MEDQ  D:  04/07/2007  T:  04/07/2007  Job:  [id number]

## 2013-12-18 ENCOUNTER — Ambulatory Visit
Admission: RE | Admit: 2013-12-18 | Discharge: 2013-12-18 | Disposition: A | Discharge status: Home / Self Care | Payer: 59 | Source: Ambulatory Visit | Attending: Nurse Practitioner | Admitting: Nurse Practitioner

## 2013-12-18 ENCOUNTER — Other Ambulatory Visit: Payer: Self-pay | Admitting: Nurse Practitioner

## 2013-12-18 DIAGNOSIS — M25551 Pain in right hip: Secondary | ICD-10-CM

## 2014-03-28 ENCOUNTER — Encounter: Payer: Self-pay | Admitting: Nurse Practitioner

## 2014-04-08 NOTE — Progress Notes (Signed)
This encounter was created in error - please disregard.

## 2014-04-18 ENCOUNTER — Ambulatory Visit (INDEPENDENT_AMBULATORY_CARE_PROVIDER_SITE_OTHER): Payer: 59 | Admitting: Nurse Practitioner

## 2014-04-18 ENCOUNTER — Encounter: Payer: Self-pay | Admitting: Nurse Practitioner

## 2014-04-18 VITALS — BP 136/84 | HR 64 | Ht 65.75 in | Wt 122.0 lb

## 2014-04-18 DIAGNOSIS — R319 Hematuria, unspecified: Secondary | ICD-10-CM

## 2014-04-18 DIAGNOSIS — Z0000 Encounter for general adult medical examination without abnormal findings: Secondary | ICD-10-CM

## 2014-04-18 DIAGNOSIS — Z01419 Encounter for gynecological examination (general) (routine) without abnormal findings: Secondary | ICD-10-CM

## 2014-04-18 LAB — HEMOGLOBIN, FINGERSTICK: Hemoglobin, fingerstick: 12.7 g/dL (ref 12.0–16.0)

## 2014-04-18 LAB — LIPID PANEL
Cholesterol: 251 mg/dL — ABNORMAL HIGH (ref 0–200)
HDL: 51 mg/dL (ref >39)
LDL Cholesterol: 168 mg/dL — ABNORMAL HIGH (ref 0–99)
Total CHOL/HDL Ratio: 4.9 Ratio
Triglycerides: 158 mg/dL — ABNORMAL HIGH (ref <150)
VLDL: 32 mg/dL (ref 0–40)

## 2014-04-18 LAB — COMPREHENSIVE METABOLIC PANEL
ALT: 68 U/L — ABNORMAL HIGH (ref 0–35)
AST: 82 U/L — ABNORMAL HIGH (ref 0–37)
Albumin: 4.3 g/dL (ref 3.5–5.2)
Alkaline Phosphatase: 134 U/L — ABNORMAL HIGH (ref 39–117)
BUN: 10 mg/dL (ref 6–23)
CO2: 27 mEq/L (ref 19–32)
Calcium: 9.9 mg/dL (ref 8.4–10.5)
Chloride: 101 mEq/L (ref 96–112)
Creat: 0.55 mg/dL (ref 0.50–1.10)
Glucose, Bld: 97 mg/dL (ref 70–99)
Potassium: 4.5 mEq/L (ref 3.5–5.3)
Sodium: 137 mEq/L (ref 135–145)
Total Bilirubin: 0.8 mg/dL (ref 0.2–1.2)
Total Protein: 7.9 g/dL (ref 6.0–8.3)

## 2014-04-18 LAB — POCT URINALYSIS DIPSTICK
Bilirubin, UA: NEGATIVE
Glucose, UA: NEGATIVE
Ketones, UA: NEGATIVE
Nitrite, UA: NEGATIVE
Protein, UA: NEGATIVE
Urobilinogen, UA: NEGATIVE
pH, UA: 5

## 2014-04-18 LAB — TSH: TSH: 2.063 u[IU]/mL (ref 0.350–4.500)

## 2014-04-18 NOTE — Progress Notes (Signed)
Patient ID: Joyce Griffin, female   DOB: [birth year], 63 y.o.   MRN: [id number] 63 y.o. G27P1001 Divorced Caucasian Fe here for [place] annual exam.  She was a former patient here and secondary to loss of insurance has not been seen for GYN care.  She is postmenopausal.  She is not SA in many years.  No current vaginal or new health issues. Denies urinary symptoms.  Patient's last menstrual period was 07/14/2003.          Sexually active: no  The current method of family planning is post menopausal status.    Exercising: yes  Home exercise routine includes walking. Smoker:  no  Health Maintenance: Pap:  2011 MMG:  2011 Colonoscopy:  2005 BMD:   2010 TDaP:  2008 Labs: HB:  12.7 Urine:  Trace leuk's, Trace RBC   reports that she quit smoking about 22 years ago. She has never used smokeless tobacco. She reports that she drinks about 2 ounces of alcohol per week. She reports that she does not use illicit drugs.  Past Medical History  Diagnosis Date  . Breast cancer 2004    left breast  . Hypertension     Past Surgical History  Procedure Laterality Date  . Mastectomy, partial Left 2012  . Breast lumpectomy  2004 X 2  . Augmentation mammaplasty Left 2004    following mastectomy    Current Outpatient Prescriptions  Medication Sig Dispense Refill  . fluticasone (FLONASE) 50 MCG/ACT nasal spray Place 1 spray into both nostrils as needed.      . metoprolol tartrate (LOPRESSOR) 25 MG tablet Take 1 tablet by mouth daily.       No current facility-administered medications for this visit.    Family History  Problem Relation Age of Onset  . COPD Father     ROS:  Pertinent items are noted in HPI.  Otherwise, a comprehensive ROS was negative.  Exam:   BP 136/84  Pulse 64  Ht 5' 5.75" (1.67 m)  Wt 122 lb (55.339 kg)  BMI 19.84 kg/m2  LMP 07/14/2003 Height: 5' 5.75" (167 cm)  Ht Readings from Last 3 Encounters:  04/18/14 5' 5.75" (1.67 m)    General appearance: alert, cooperative and  appears stated age Head: Normocephalic, without obvious abnormality, atraumatic Neck: no adenopathy, supple, symmetrical, trachea midline and thyroid normal to inspection and palpation Lungs: clear to auscultation bilaterally Breasts: positive findings: implants bilaterally and surgical changes with tram flap on the left, no mass the left, only surgical changes Heart: regular rate and rhythm Abdomen: soft, non-tender; no masses,  no organomegaly Extremities: extremities normal, atraumatic, no cyanosis or edema Skin: Skin color, texture, turgor normal. No rashes or lesions Lymph nodes: Cervical, supraclavicular, and axillary nodes normal. No abnormal inguinal nodes palpated Neurologic: Grossly normal   Pelvic: External genitalia:  no lesions              Urethra:  normal appearing urethra with no masses, tenderness or lesions              Bartholin's and Skene's: normal                 Vagina: normal appearing vagina with normal color and discharge, no lesions              Cervix: anteverted              Pap taken: yes Bimanual Exam:  Uterus:  normal size, contour, position, consistency, mobility, non-tender  Adnexa: no mass, fullness, tenderness               Rectovaginal: Confirms               Anus:  normal sphincter tone, no lesions  A:  Well Woman with normal exam  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

## 2014-04-18 NOTE — Patient Instructions (Signed)

## 2014-04-19 LAB — URINALYSIS, MICROSCOPIC ONLY
Bacteria, UA: NONE SEEN
Casts: NONE SEEN
Crystals: NONE SEEN
Squamous Epithelial / LPF: NONE SEEN

## 2014-04-19 LAB — VITAMIN D 25 HYDROXY (VIT D DEFICIENCY, FRACTURES): Vit D, 25-Hydroxy: 48 ng/mL (ref 30–89)

## 2014-04-19 LAB — URINE CULTURE: Colony Count: 2000

## 2014-04-21 NOTE — Progress Notes (Signed)
Encounter reviewed by Dr. Brook Silva.  

## 2014-04-22 LAB — IPS PAP TEST WITH HPV

## 2014-05-08 ENCOUNTER — Ambulatory Visit (INDEPENDENT_AMBULATORY_CARE_PROVIDER_SITE_OTHER): Payer: 59 | Admitting: *Deleted

## 2014-05-08 DIAGNOSIS — R899 Unspecified abnormal finding in specimens from other organs, systems and tissues: Secondary | ICD-10-CM

## 2014-05-08 DIAGNOSIS — R319 Hematuria, unspecified: Secondary | ICD-10-CM

## 2014-05-08 DIAGNOSIS — R6889 Other general symptoms and signs: Secondary | ICD-10-CM

## 2014-05-08 LAB — HEPATIC FUNCTION PANEL
ALT: 43 U/L — ABNORMAL HIGH (ref 0–35)
AST: 48 U/L — ABNORMAL HIGH (ref 0–37)
Albumin: 4.8 g/dL (ref 3.5–5.2)
Alkaline Phosphatase: 111 U/L (ref 39–117)
Bilirubin, Direct: 0.1 mg/dL (ref 0.0–0.3)
Indirect Bilirubin: 0.4 mg/dL (ref 0.2–1.2)
Total Bilirubin: 0.5 mg/dL (ref 0.2–1.2)
Total Protein: 7.9 g/dL (ref 6.0–8.3)

## 2014-05-08 NOTE — Progress Notes (Signed)
Patient ID: ZAYDEE AINA, female   DOB: 05-09-1951, 63 y.o.   MRN: [id number] Pt arrived for repeat urine collection and LFT's.  Urine collected and sent.  Culture and micro ordered per Dr. Quincy Simmonds.  LFT's drawn.

## 2014-05-09 LAB — URINALYSIS, MICROSCOPIC ONLY
Bacteria, UA: NONE SEEN
Casts: NONE SEEN
Crystals: NONE SEEN
Squamous Epithelial / LPF: NONE SEEN

## 2014-05-09 LAB — URINE CULTURE
Colony Count: NO GROWTH
Organism ID, Bacteria: NO GROWTH

## 2014-10-14 ENCOUNTER — Encounter: Payer: Self-pay | Admitting: Nurse Practitioner

## 2015-04-18 ENCOUNTER — Telehealth: Payer: Self-pay | Admitting: Nurse Practitioner

## 2015-04-18 NOTE — Telephone Encounter (Signed)
Left patient a message to call back when ready to reschedule, canceled by automated reminder call.

## 2015-04-22 ENCOUNTER — Ambulatory Visit: Payer: 59 | Admitting: Nurse Practitioner

## 2016-05-31 ENCOUNTER — Other Ambulatory Visit: Payer: Self-pay | Admitting: Internal Medicine

## 2016-05-31 DIAGNOSIS — Z1231 Encounter for screening mammogram for malignant neoplasm of breast: Secondary | ICD-10-CM

## 2016-06-08 ENCOUNTER — Ambulatory Visit
Admission: RE | Admit: 2016-06-08 | Discharge: 2016-06-08 | Disposition: A | Discharge status: Home / Self Care | Payer: No Typology Code available for payment source | Source: Ambulatory Visit | Attending: Internal Medicine | Admitting: Internal Medicine

## 2016-06-08 ENCOUNTER — Other Ambulatory Visit: Payer: Self-pay | Admitting: Internal Medicine

## 2016-06-08 DIAGNOSIS — Z1231 Encounter for screening mammogram for malignant neoplasm of breast: Secondary | ICD-10-CM

## 2017-12-19 DIAGNOSIS — H524 Presbyopia: Secondary | ICD-10-CM | POA: Diagnosis not present

## 2017-12-19 DIAGNOSIS — H2513 Age-related nuclear cataract, bilateral: Secondary | ICD-10-CM | POA: Diagnosis not present

## 2017-12-21 DIAGNOSIS — E7800 Pure hypercholesterolemia, unspecified: Secondary | ICD-10-CM | POA: Diagnosis not present

## 2017-12-21 DIAGNOSIS — Z1211 Encounter for screening for malignant neoplasm of colon: Secondary | ICD-10-CM | POA: Diagnosis not present

## 2017-12-21 DIAGNOSIS — I10 Essential (primary) hypertension: Secondary | ICD-10-CM | POA: Diagnosis not present

## 2017-12-21 DIAGNOSIS — E2839 Other primary ovarian failure: Secondary | ICD-10-CM | POA: Diagnosis not present

## 2017-12-21 DIAGNOSIS — Z23 Encounter for immunization: Secondary | ICD-10-CM | POA: Diagnosis not present

## 2017-12-21 DIAGNOSIS — Z1231 Encounter for screening mammogram for malignant neoplasm of breast: Secondary | ICD-10-CM | POA: Diagnosis not present

## 2017-12-21 DIAGNOSIS — M8580 Other specified disorders of bone density and structure, unspecified site: Secondary | ICD-10-CM | POA: Diagnosis not present

## 2017-12-29 DIAGNOSIS — I10 Essential (primary) hypertension: Secondary | ICD-10-CM | POA: Diagnosis not present

## 2017-12-29 DIAGNOSIS — F419 Anxiety disorder, unspecified: Secondary | ICD-10-CM | POA: Diagnosis not present

## 2018-01-19 DIAGNOSIS — K640 First degree hemorrhoids: Secondary | ICD-10-CM | POA: Diagnosis not present

## 2018-01-19 DIAGNOSIS — K635 Polyp of colon: Secondary | ICD-10-CM | POA: Diagnosis not present

## 2018-01-19 DIAGNOSIS — K5730 Diverticulosis of large intestine without perforation or abscess without bleeding: Secondary | ICD-10-CM | POA: Diagnosis not present

## 2018-01-19 DIAGNOSIS — Z1211 Encounter for screening for malignant neoplasm of colon: Secondary | ICD-10-CM | POA: Diagnosis not present

## 2018-01-24 DIAGNOSIS — Z1211 Encounter for screening for malignant neoplasm of colon: Secondary | ICD-10-CM | POA: Diagnosis not present

## 2018-01-24 DIAGNOSIS — K635 Polyp of colon: Secondary | ICD-10-CM | POA: Diagnosis not present

## 2018-03-02 DIAGNOSIS — Z01419 Encounter for gynecological examination (general) (routine) without abnormal findings: Secondary | ICD-10-CM | POA: Diagnosis not present

## 2018-03-02 DIAGNOSIS — N952 Postmenopausal atrophic vaginitis: Secondary | ICD-10-CM | POA: Diagnosis not present

## 2018-03-09 ENCOUNTER — Emergency Department (HOSPITAL_BASED_OUTPATIENT_CLINIC_OR_DEPARTMENT_OTHER)
Admission: EM | Admit: 2018-03-09 | Discharge: 2018-03-09 | Disposition: A | Discharge status: Home / Self Care | Payer: Medicare Other | Attending: Emergency Medicine | Admitting: Emergency Medicine

## 2018-03-09 ENCOUNTER — Encounter (HOSPITAL_BASED_OUTPATIENT_CLINIC_OR_DEPARTMENT_OTHER): Payer: Self-pay | Admitting: Emergency Medicine

## 2018-03-09 ENCOUNTER — Other Ambulatory Visit: Payer: Self-pay

## 2018-03-09 DIAGNOSIS — Z87891 Personal history of nicotine dependence: Secondary | ICD-10-CM | POA: Insufficient documentation

## 2018-03-09 DIAGNOSIS — Z853 Personal history of malignant neoplasm of breast: Secondary | ICD-10-CM | POA: Diagnosis not present

## 2018-03-09 DIAGNOSIS — R51 Headache: Secondary | ICD-10-CM | POA: Insufficient documentation

## 2018-03-09 DIAGNOSIS — R42 Dizziness and giddiness: Secondary | ICD-10-CM | POA: Diagnosis not present

## 2018-03-09 DIAGNOSIS — R030 Elevated blood-pressure reading, without diagnosis of hypertension: Secondary | ICD-10-CM | POA: Diagnosis not present

## 2018-03-09 DIAGNOSIS — Z9012 Acquired absence of left breast and nipple: Secondary | ICD-10-CM | POA: Insufficient documentation

## 2018-03-09 DIAGNOSIS — H538 Other visual disturbances: Secondary | ICD-10-CM | POA: Insufficient documentation

## 2018-03-09 DIAGNOSIS — I10 Essential (primary) hypertension: Secondary | ICD-10-CM | POA: Diagnosis not present

## 2018-03-09 DIAGNOSIS — Z79899 Other long term (current) drug therapy: Secondary | ICD-10-CM | POA: Diagnosis not present

## 2018-03-09 HISTORY — DX: Nonalcoholic steatohepatitis (NASH): K75.81

## 2018-03-09 LAB — CBC WITH DIFFERENTIAL/PLATELET
Basophils Absolute: 0 10*3/uL (ref 0.0–0.1)
Basophils Relative: 0 %
Eosinophils Absolute: 0 10*3/uL (ref 0.0–0.7)
Eosinophils Relative: 1 %
HCT: 40.2 % (ref 36.0–46.0)
Hemoglobin: 12.9 g/dL (ref 12.0–15.0)
Lymphocytes Relative: 26 %
Lymphs Abs: 1.4 10*3/uL (ref 0.7–4.0)
MCH: 32.7 pg (ref 26.0–34.0)
MCHC: 32.1 g/dL (ref 30.0–36.0)
MCV: 102 fL — ABNORMAL HIGH (ref 78.0–100.0)
Monocytes Absolute: 0.3 10*3/uL (ref 0.1–1.0)
Monocytes Relative: 6 %
Neutro Abs: 3.6 10*3/uL (ref 1.7–7.7)
Neutrophils Relative %: 67 %
Platelets: 181 10*3/uL (ref 150–400)
RBC: 3.94 MIL/uL (ref 3.87–5.11)
RDW: 12.6 % (ref 11.5–15.5)
WBC: 5.3 10*3/uL (ref 4.0–10.5)

## 2018-03-09 LAB — BASIC METABOLIC PANEL
Anion gap: 10 (ref 5–15)
BUN: 7 mg/dL (ref 6–20)
CO2: 27 mmol/L (ref 22–32)
Calcium: 9.2 mg/dL (ref 8.9–10.3)
Chloride: 98 mmol/L — ABNORMAL LOW (ref 101–111)
Creatinine, Ser: 0.58 mg/dL (ref 0.44–1.00)
GFR calc Af Amer: >60 mL/min (ref >60)
GFR calc non Af Amer: >60 mL/min (ref >60)
Glucose, Bld: 113 mg/dL — ABNORMAL HIGH (ref 65–99)
Potassium: 4.2 mmol/L (ref 3.5–5.1)
Sodium: 135 mmol/L (ref 135–145)

## 2018-03-09 MED ORDER — ACETAMINOPHEN 325 MG PO TABS
650.0000 mg | ORAL_TABLET | Freq: Once | ORAL | Status: DC
  Filled 2018-03-09: qty 2

## 2018-03-09 NOTE — ED Notes (Signed)
Patient in no acute distress; denies pain; states mild dizziness; denies nausea.

## 2018-03-09 NOTE — ED Triage Notes (Signed)
Pt to ED via EMS w/ c/o elevated BP; metoprolol increased about 1 mo ago; checked BP at home and was concerned, so she walked 2 blocks to the fire dept to be checked out

## 2018-03-09 NOTE — Discharge Instructions (Signed)
Call your doctor today or tomorrow for further management of your blood pressure. Limit the amount of times you are taking your blood pressure per day. Please return to the emergency department if you develop any new or worsening symptoms.

## 2018-03-09 NOTE — ED Provider Notes (Signed)
[facility] EMERGENCY DEPARTMENT Provider Note   CSN: [id number] Arrival date & time: 03/09/18  1427     History   Chief Complaint Chief Complaint  Patient presents with  . Hypertension    HPI Joyce Griffin is a 67 y.o. female with history of hypertension, Joyce Griffin, breast cancer who presents with hypertension.  Patient reports she takes her blood pressure at home 5 times daily.  Today it was over 660 systolic.  She walked 2 blocks to the fire station who rechecked it and told her she should come here to be checked.  Patient has a very dull headache that she rates as a 1/10.  She reports she has noticed a little more blurry vision than usual for the past few days.  She denies any numbness, tingling, dizziness, lightheadedness.  She denies any chest pain, shortness of breath, abdominal pain, nausea, vomiting, urinary symptoms.  Patient reports she takes metoprolol 50 mg each morning.  She has been at that dose for about 1 month.  Prior she was on metoprolol 25 daily.  HPI  Past Medical History:  Diagnosis Date  . Breast cancer ([place]) 2004   left breast  . Hypertension   . NASH (nonalcoholic steatohepatitis)     There are no active problems to display for this patient.   Past Surgical History:  Procedure Laterality Date  . ABDOMINOPLASTY  2004  . APPENDECTOMY    . AUGMENTATION MAMMAPLASTY Left 2004   following mastectomy  . BREAST LUMPECTOMY  2004 X 2  . MASTECTOMY, PARTIAL Left 2012     OB History    Gravida  1   Para  1   Term  1   Preterm      AB      Living  1     SAB      TAB      Ectopic      Multiple      Live Births               Home Medications    Prior to Admission medications   Medication Sig Start Date End Date Taking? Authorizing Provider  fluticasone (FLONASE) 50 MCG/ACT nasal spray Place 1 spray into both nostrils as needed. 01/24/14   [provider]  metoprolol tartrate (LOPRESSOR) 25 MG tablet Take 1 tablet by  mouth daily. 04/08/14   [provider]    Family History Family History  Problem Relation Age of Onset  . COPD Father     Social History Social History   Tobacco Use  . Smoking status: Former Smoker    Packs/day: 0.50    Years: 25.00    Pack years: 12.50    Last attempt to quit: 02/29/1992    Years since quitting: 26.0  . Smokeless tobacco: Never Used  Substance Use Topics  . Alcohol use: Yes    Alcohol/week: 2.0 oz    Types: 4 Standard drinks or equivalent per week    Comment: occ  . Drug use: No     Allergies   Codeine and Neosporin [neomycin-bacitracin zn-polymyx]   Review of Systems Review of Systems  Constitutional: Negative for chills and fever.  HENT: Negative for facial swelling and sore throat.   Eyes: Positive for visual disturbance.  Respiratory: Negative for shortness of breath.   Cardiovascular: Negative for chest pain.  Gastrointestinal: Negative for abdominal pain, nausea and vomiting.  Genitourinary: Negative for dysuria.  Musculoskeletal: Negative for back pain.  Skin:  Negative for rash and wound.  Neurological: Positive for headaches. Negative for dizziness, weakness, light-headedness and numbness.  Psychiatric/Behavioral: The patient is not nervous/anxious.      Physical Exam Updated Vital Signs BP (!) 166/70   Pulse 67   Temp 98.2 F (36.8 C) (Oral)   Resp 18   Ht 5' 6.5" (1.689 m)   Wt 59 kg (130 lb)   LMP 07/14/2003   SpO2 100%   BMI 20.67 kg/m   Physical Exam  Constitutional: She appears well-developed and well-nourished. No distress.  HENT:  Head: Normocephalic and atraumatic.  Mouth/Throat: Oropharynx is clear and moist. No oropharyngeal exudate.  Eyes: Pupils are equal, round, and reactive to light. Conjunctivae are normal. Right eye exhibits no discharge. Left eye exhibits no discharge. No scleral icterus.  Neck: Normal range of motion. Neck supple. No thyromegaly present.  Cardiovascular: Normal rate, regular  rhythm, normal heart sounds and intact distal pulses. Exam reveals no gallop and no friction rub.  No murmur heard. Pulmonary/Chest: Effort normal and breath sounds normal. No stridor. No respiratory distress. She has no wheezes. She has no rales.  Abdominal: Soft. Bowel sounds are normal. She exhibits no distension. There is no tenderness. There is no rebound and no guarding.  Musculoskeletal: She exhibits no edema.  Lymphadenopathy:    She has no cervical adenopathy.  Neurological: She is alert. Coordination normal.  CN 3-12 intact; normal sensation throughout; 5/5 strength in all 4 extremities; equal bilateral grip strength; no ataxia on finger-to-nose  Skin: Skin is warm and dry. No rash noted. She is not diaphoretic. No pallor.  Psychiatric: She has a normal mood and affect.  Nursing note and vitals reviewed.    ED Treatments / Results  Labs (all labs ordered are listed, but only abnormal results are displayed) Labs Reviewed  BASIC METABOLIC PANEL - Abnormal; Notable for the following components:      Result Value   Chloride 98 (*)    Glucose, Bld 113 (*)    All other components within normal limits  CBC WITH DIFFERENTIAL/PLATELET - Abnormal; Notable for the following components:   MCV 102.0 (*)    All other components within normal limits    EKG None  Radiology No results found.  Procedures Procedures (including critical care time)  Medications Ordered in ED Medications  acetaminophen (TYLENOL) tablet 650 mg (650 mg Oral Refused 03/09/18 1457)     Initial Impression / Assessment and Plan / ED Course  I have reviewed the triage vital signs and the nursing notes.  Pertinent labs & imaging results that were available during my care of the patient were reviewed by me and considered in my medical decision making (see chart for details).     Patient with elevated blood pressure, reduced in the ED to 166/70 without intervention.  CBC, BMP within normal limits.   Patient has normal neuro exam without focal deficits.  Will refer patient to PCP for further management.  Patient denies any chest pain, shortness of breath, severe headache, weakness, tingling, speech difficulties.  No signs of hypertensive emergency or urgency at this time.  Return precautions discussed.  Patient understands and agrees with plan.  Patient discharged in satisfactory condition.  Patient also evaluated by Dr. Laverta Baltimore who guided the patient's management and agrees with plan.  Final Clinical Impressions(s) / ED Diagnoses   Final diagnoses:  Hypertension, unspecified type    ED Discharge Orders    None       Law, Claris Pong  Curt Jews 03/09/18 1754    Long, Wonda Olds, MD 03/10/18 1244

## 2018-03-14 DIAGNOSIS — I10 Essential (primary) hypertension: Secondary | ICD-10-CM | POA: Diagnosis not present

## 2018-03-14 DIAGNOSIS — F419 Anxiety disorder, unspecified: Secondary | ICD-10-CM | POA: Diagnosis not present

## 2018-04-12 DIAGNOSIS — F419 Anxiety disorder, unspecified: Secondary | ICD-10-CM | POA: Diagnosis not present

## 2018-04-12 DIAGNOSIS — I10 Essential (primary) hypertension: Secondary | ICD-10-CM | POA: Diagnosis not present

## 2018-07-11 DIAGNOSIS — L259 Unspecified contact dermatitis, unspecified cause: Secondary | ICD-10-CM | POA: Diagnosis not present

## 2018-09-20 DIAGNOSIS — Z23 Encounter for immunization: Secondary | ICD-10-CM | POA: Diagnosis not present

## 2018-12-21 DIAGNOSIS — H2513 Age-related nuclear cataract, bilateral: Secondary | ICD-10-CM | POA: Diagnosis not present

## 2019-01-18 ENCOUNTER — Other Ambulatory Visit: Payer: Self-pay | Admitting: Internal Medicine

## 2019-01-18 DIAGNOSIS — D7589 Other specified diseases of blood and blood-forming organs: Secondary | ICD-10-CM | POA: Diagnosis not present

## 2019-01-18 DIAGNOSIS — M8580 Other specified disorders of bone density and structure, unspecified site: Secondary | ICD-10-CM | POA: Diagnosis not present

## 2019-01-18 DIAGNOSIS — R739 Hyperglycemia, unspecified: Secondary | ICD-10-CM | POA: Diagnosis not present

## 2019-01-18 DIAGNOSIS — Z23 Encounter for immunization: Secondary | ICD-10-CM | POA: Diagnosis not present

## 2019-01-18 DIAGNOSIS — I10 Essential (primary) hypertension: Secondary | ICD-10-CM | POA: Diagnosis not present

## 2019-01-18 DIAGNOSIS — C50919 Malignant neoplasm of unspecified site of unspecified female breast: Secondary | ICD-10-CM | POA: Diagnosis not present

## 2019-01-18 DIAGNOSIS — K7581 Nonalcoholic steatohepatitis (NASH): Secondary | ICD-10-CM | POA: Diagnosis not present

## 2019-01-18 DIAGNOSIS — Z1389 Encounter for screening for other disorder: Secondary | ICD-10-CM | POA: Diagnosis not present

## 2019-01-18 DIAGNOSIS — F419 Anxiety disorder, unspecified: Secondary | ICD-10-CM | POA: Diagnosis not present

## 2019-01-18 DIAGNOSIS — Z0000 Encounter for general adult medical examination without abnormal findings: Secondary | ICD-10-CM | POA: Diagnosis not present

## 2019-01-18 DIAGNOSIS — E7800 Pure hypercholesterolemia, unspecified: Secondary | ICD-10-CM | POA: Diagnosis not present

## 2019-01-23 DIAGNOSIS — E039 Hypothyroidism, unspecified: Secondary | ICD-10-CM | POA: Diagnosis not present

## 2019-03-20 ENCOUNTER — Other Ambulatory Visit: Payer: Medicare Other

## 2019-04-06 ENCOUNTER — Other Ambulatory Visit: Payer: Self-pay

## 2019-04-06 ENCOUNTER — Ambulatory Visit
Admission: RE | Admit: 2019-04-06 | Discharge: 2019-04-06 | Disposition: A | Discharge status: Home / Self Care | Payer: Medicare Other | Source: Ambulatory Visit | Attending: Internal Medicine | Admitting: Internal Medicine

## 2019-04-06 ENCOUNTER — Other Ambulatory Visit: Payer: Self-pay | Admitting: Internal Medicine

## 2019-04-06 DIAGNOSIS — R140 Abdominal distension (gaseous): Secondary | ICD-10-CM

## 2019-04-06 DIAGNOSIS — E039 Hypothyroidism, unspecified: Secondary | ICD-10-CM | POA: Diagnosis not present

## 2019-04-09 DIAGNOSIS — K7460 Unspecified cirrhosis of liver: Secondary | ICD-10-CM | POA: Diagnosis not present

## 2019-04-10 ENCOUNTER — Ambulatory Visit
Admission: RE | Admit: 2019-04-10 | Discharge: 2019-04-10 | Disposition: A | Discharge status: Home / Self Care | Payer: Medicare Other | Source: Ambulatory Visit | Attending: Internal Medicine | Admitting: Internal Medicine

## 2019-04-10 ENCOUNTER — Other Ambulatory Visit: Payer: Self-pay | Admitting: Internal Medicine

## 2019-04-10 ENCOUNTER — Other Ambulatory Visit: Payer: Self-pay

## 2019-04-10 DIAGNOSIS — K769 Liver disease, unspecified: Secondary | ICD-10-CM

## 2019-04-10 DIAGNOSIS — R188 Other ascites: Secondary | ICD-10-CM | POA: Diagnosis not present

## 2019-04-10 DIAGNOSIS — R140 Abdominal distension (gaseous): Secondary | ICD-10-CM

## 2019-04-10 MED ORDER — IOPAMIDOL (ISOVUE-300) INJECTION 61%
100.0000 mL | Freq: Once | INTRAVENOUS | Status: AC | PRN
  Administered 2019-04-10: 100 mL via INTRAVENOUS

## 2019-04-17 ENCOUNTER — Other Ambulatory Visit: Payer: Self-pay | Admitting: Gastroenterology

## 2019-04-17 ENCOUNTER — Other Ambulatory Visit (HOSPITAL_COMMUNITY): Payer: Self-pay | Admitting: Gastroenterology

## 2019-04-17 DIAGNOSIS — R188 Other ascites: Secondary | ICD-10-CM | POA: Diagnosis not present

## 2019-04-17 DIAGNOSIS — K7460 Unspecified cirrhosis of liver: Secondary | ICD-10-CM

## 2019-04-19 ENCOUNTER — Ambulatory Visit (HOSPITAL_COMMUNITY)
Admission: RE | Admit: 2019-04-19 | Discharge: 2019-04-19 | Disposition: A | Discharge status: Home / Self Care | Payer: Medicare Other | Source: Ambulatory Visit | Attending: Gastroenterology | Admitting: Gastroenterology

## 2019-04-19 ENCOUNTER — Encounter (HOSPITAL_COMMUNITY): Payer: Self-pay | Admitting: Physician Assistant

## 2019-04-19 ENCOUNTER — Other Ambulatory Visit: Payer: Self-pay

## 2019-04-19 DIAGNOSIS — R188 Other ascites: Secondary | ICD-10-CM | POA: Diagnosis not present

## 2019-04-19 DIAGNOSIS — R896 Abnormal cytological findings in specimens from other organs, systems and tissues: Secondary | ICD-10-CM | POA: Diagnosis not present

## 2019-04-19 DIAGNOSIS — K7460 Unspecified cirrhosis of liver: Secondary | ICD-10-CM | POA: Diagnosis not present

## 2019-04-19 HISTORY — PX: IR PARACENTESIS: IMG2679

## 2019-04-19 LAB — BODY FLUID CELL COUNT WITH DIFFERENTIAL
Lymphs, Fluid: 77 %
Monocyte-Macrophage-Serous Fluid: 9 % — ABNORMAL LOW (ref 50–90)
Neutrophil Count, Fluid: 14 % (ref 0–25)
Total Nucleated Cell Count, Fluid: 83 cu mm (ref 0–1000)

## 2019-04-19 LAB — PROTEIN, PLEURAL OR PERITONEAL FLUID: Total protein, fluid: <3 g/dL

## 2019-04-19 LAB — ALBUMIN, PLEURAL OR PERITONEAL FLUID: Albumin, Fluid: <1 g/dL

## 2019-04-19 LAB — GLUCOSE, PLEURAL OR PERITONEAL FLUID: Glucose, Fluid: 109 mg/dL

## 2019-04-19 MED ORDER — LIDOCAINE HCL (PF) 1 % IJ SOLN
INTRAMUSCULAR | Status: AC | PRN
  Administered 2019-04-19: 10 mL

## 2019-04-19 MED ORDER — LIDOCAINE HCL 1 % IJ SOLN
INTRAMUSCULAR | Status: AC
  Filled 2019-04-19: qty 20

## 2019-04-19 NOTE — Procedures (Signed)
PROCEDURE SUMMARY:  Successful US guided paracentesis from right lateral abdomen.  Yielded right of 1.6 L hazy yellow fluid.  No immediate complications.  Patient tolerated well.  EBL = trace  Specimen was sent for labs.  WENDY S BLAIR PA-C 04/19/2019 10:45 AM

## 2019-04-20 LAB — PATHOLOGIST SMEAR REVIEW

## 2019-04-27 DIAGNOSIS — R188 Other ascites: Secondary | ICD-10-CM | POA: Diagnosis not present

## 2019-05-14 DIAGNOSIS — K7460 Unspecified cirrhosis of liver: Secondary | ICD-10-CM | POA: Diagnosis not present

## 2019-05-21 DIAGNOSIS — K7460 Unspecified cirrhosis of liver: Secondary | ICD-10-CM | POA: Diagnosis not present

## 2019-05-22 DIAGNOSIS — E871 Hypo-osmolality and hyponatremia: Secondary | ICD-10-CM | POA: Diagnosis not present

## 2019-05-22 DIAGNOSIS — R188 Other ascites: Secondary | ICD-10-CM | POA: Diagnosis not present

## 2019-05-22 DIAGNOSIS — K7460 Unspecified cirrhosis of liver: Secondary | ICD-10-CM | POA: Diagnosis not present

## 2019-05-29 DIAGNOSIS — E871 Hypo-osmolality and hyponatremia: Secondary | ICD-10-CM | POA: Diagnosis not present

## 2019-06-08 DIAGNOSIS — K7460 Unspecified cirrhosis of liver: Secondary | ICD-10-CM | POA: Diagnosis not present

## 2019-07-02 DIAGNOSIS — K7460 Unspecified cirrhosis of liver: Secondary | ICD-10-CM | POA: Diagnosis not present

## 2019-07-09 DIAGNOSIS — K7460 Unspecified cirrhosis of liver: Secondary | ICD-10-CM | POA: Diagnosis not present

## 2019-07-09 DIAGNOSIS — Z1159 Encounter for screening for other viral diseases: Secondary | ICD-10-CM | POA: Diagnosis not present

## 2019-08-11 DIAGNOSIS — Z23 Encounter for immunization: Secondary | ICD-10-CM | POA: Diagnosis not present

## 2019-08-17 DIAGNOSIS — Z1159 Encounter for screening for other viral diseases: Secondary | ICD-10-CM | POA: Diagnosis not present

## 2019-08-17 DIAGNOSIS — Z23 Encounter for immunization: Secondary | ICD-10-CM | POA: Diagnosis not present

## 2019-08-17 DIAGNOSIS — K7030 Alcoholic cirrhosis of liver without ascites: Secondary | ICD-10-CM | POA: Diagnosis not present

## 2019-08-22 DIAGNOSIS — I8510 Secondary esophageal varices without bleeding: Secondary | ICD-10-CM | POA: Diagnosis not present

## 2019-08-22 DIAGNOSIS — K7460 Unspecified cirrhosis of liver: Secondary | ICD-10-CM | POA: Diagnosis not present

## 2019-08-28 DIAGNOSIS — K7030 Alcoholic cirrhosis of liver without ascites: Secondary | ICD-10-CM | POA: Diagnosis not present

## 2019-09-20 ENCOUNTER — Other Ambulatory Visit: Payer: Self-pay | Admitting: Gastroenterology

## 2019-09-20 DIAGNOSIS — K7030 Alcoholic cirrhosis of liver without ascites: Secondary | ICD-10-CM

## 2019-09-26 ENCOUNTER — Ambulatory Visit
Admission: RE | Admit: 2019-09-26 | Discharge: 2019-09-26 | Disposition: A | Discharge status: Home / Self Care | Payer: Medicare Other | Source: Ambulatory Visit | Attending: Gastroenterology | Admitting: Gastroenterology

## 2019-09-26 DIAGNOSIS — K8020 Calculus of gallbladder without cholecystitis without obstruction: Secondary | ICD-10-CM | POA: Diagnosis not present

## 2019-09-26 DIAGNOSIS — K7030 Alcoholic cirrhosis of liver without ascites: Secondary | ICD-10-CM

## 2019-12-24 DIAGNOSIS — K7031 Alcoholic cirrhosis of liver with ascites: Secondary | ICD-10-CM | POA: Diagnosis not present

## 2019-12-25 DIAGNOSIS — H2513 Age-related nuclear cataract, bilateral: Secondary | ICD-10-CM | POA: Diagnosis not present

## 2020-01-25 DIAGNOSIS — Z23 Encounter for immunization: Secondary | ICD-10-CM | POA: Diagnosis not present

## 2020-02-07 ENCOUNTER — Other Ambulatory Visit: Payer: Self-pay | Admitting: Internal Medicine

## 2020-02-07 ENCOUNTER — Ambulatory Visit
Admission: RE | Admit: 2020-02-07 | Discharge: 2020-02-07 | Disposition: A | Discharge status: Home / Self Care | Payer: Medicare Other | Source: Ambulatory Visit | Attending: Internal Medicine | Admitting: Internal Medicine

## 2020-02-07 DIAGNOSIS — M79602 Pain in left arm: Secondary | ICD-10-CM | POA: Diagnosis not present

## 2020-02-07 DIAGNOSIS — M542 Cervicalgia: Secondary | ICD-10-CM

## 2020-02-07 DIAGNOSIS — M79601 Pain in right arm: Secondary | ICD-10-CM | POA: Diagnosis not present

## 2020-02-07 DIAGNOSIS — E039 Hypothyroidism, unspecified: Secondary | ICD-10-CM | POA: Diagnosis not present

## 2020-02-07 DIAGNOSIS — E7800 Pure hypercholesterolemia, unspecified: Secondary | ICD-10-CM | POA: Diagnosis not present

## 2020-02-07 DIAGNOSIS — C50919 Malignant neoplasm of unspecified site of unspecified female breast: Secondary | ICD-10-CM | POA: Diagnosis not present

## 2020-02-07 DIAGNOSIS — Z0000 Encounter for general adult medical examination without abnormal findings: Secondary | ICD-10-CM | POA: Diagnosis not present

## 2020-02-07 DIAGNOSIS — M8580 Other specified disorders of bone density and structure, unspecified site: Secondary | ICD-10-CM | POA: Diagnosis not present

## 2020-02-07 DIAGNOSIS — I10 Essential (primary) hypertension: Secondary | ICD-10-CM | POA: Diagnosis not present

## 2020-02-07 DIAGNOSIS — K7030 Alcoholic cirrhosis of liver without ascites: Secondary | ICD-10-CM | POA: Diagnosis not present

## 2020-02-07 DIAGNOSIS — Z1389 Encounter for screening for other disorder: Secondary | ICD-10-CM | POA: Diagnosis not present

## 2020-02-14 DIAGNOSIS — G5600 Carpal tunnel syndrome, unspecified upper limb: Secondary | ICD-10-CM | POA: Diagnosis not present

## 2020-02-14 DIAGNOSIS — G629 Polyneuropathy, unspecified: Secondary | ICD-10-CM | POA: Diagnosis not present

## 2020-02-22 DIAGNOSIS — Z23 Encounter for immunization: Secondary | ICD-10-CM | POA: Diagnosis not present

## 2020-03-28 ENCOUNTER — Other Ambulatory Visit: Payer: Self-pay | Admitting: Gastroenterology

## 2020-03-28 DIAGNOSIS — K7030 Alcoholic cirrhosis of liver without ascites: Secondary | ICD-10-CM

## 2020-04-01 ENCOUNTER — Ambulatory Visit
Admission: RE | Admit: 2020-04-01 | Discharge: 2020-04-01 | Disposition: A | Discharge status: Home / Self Care | Payer: Medicare Other | Source: Ambulatory Visit | Attending: Gastroenterology | Admitting: Gastroenterology

## 2020-04-01 DIAGNOSIS — K8020 Calculus of gallbladder without cholecystitis without obstruction: Secondary | ICD-10-CM | POA: Diagnosis not present

## 2020-04-01 DIAGNOSIS — K7030 Alcoholic cirrhosis of liver without ascites: Secondary | ICD-10-CM | POA: Diagnosis not present

## 2020-04-09 ENCOUNTER — Other Ambulatory Visit (HOSPITAL_COMMUNITY): Payer: Self-pay | Admitting: Transplant Hepatology

## 2020-04-09 ENCOUNTER — Other Ambulatory Visit: Payer: Self-pay | Admitting: Transplant Hepatology

## 2020-04-09 DIAGNOSIS — K7460 Unspecified cirrhosis of liver: Secondary | ICD-10-CM

## 2020-04-09 DIAGNOSIS — K7581 Nonalcoholic steatohepatitis (NASH): Secondary | ICD-10-CM

## 2020-04-30 DIAGNOSIS — K7030 Alcoholic cirrhosis of liver without ascites: Secondary | ICD-10-CM | POA: Diagnosis not present

## 2020-05-02 DIAGNOSIS — E7800 Pure hypercholesterolemia, unspecified: Secondary | ICD-10-CM | POA: Diagnosis not present

## 2020-05-02 DIAGNOSIS — C50919 Malignant neoplasm of unspecified site of unspecified female breast: Secondary | ICD-10-CM | POA: Diagnosis not present

## 2020-05-02 DIAGNOSIS — M8580 Other specified disorders of bone density and structure, unspecified site: Secondary | ICD-10-CM | POA: Diagnosis not present

## 2020-05-02 DIAGNOSIS — E039 Hypothyroidism, unspecified: Secondary | ICD-10-CM | POA: Diagnosis not present

## 2020-05-02 DIAGNOSIS — I10 Essential (primary) hypertension: Secondary | ICD-10-CM | POA: Diagnosis not present

## 2020-07-07 DIAGNOSIS — Z79899 Other long term (current) drug therapy: Secondary | ICD-10-CM | POA: Diagnosis not present

## 2020-07-07 DIAGNOSIS — K7460 Unspecified cirrhosis of liver: Secondary | ICD-10-CM | POA: Diagnosis not present

## 2020-07-07 DIAGNOSIS — E039 Hypothyroidism, unspecified: Secondary | ICD-10-CM | POA: Diagnosis not present

## 2020-07-07 DIAGNOSIS — M8580 Other specified disorders of bone density and structure, unspecified site: Secondary | ICD-10-CM | POA: Diagnosis not present

## 2020-07-07 DIAGNOSIS — K7031 Alcoholic cirrhosis of liver with ascites: Secondary | ICD-10-CM | POA: Diagnosis not present

## 2020-07-07 DIAGNOSIS — Z79890 Hormone replacement therapy (postmenopausal): Secondary | ICD-10-CM | POA: Diagnosis not present

## 2020-07-07 DIAGNOSIS — R0602 Shortness of breath: Secondary | ICD-10-CM | POA: Diagnosis not present

## 2020-07-07 DIAGNOSIS — D649 Anemia, unspecified: Secondary | ICD-10-CM | POA: Diagnosis not present

## 2020-07-07 DIAGNOSIS — E785 Hyperlipidemia, unspecified: Secondary | ICD-10-CM | POA: Diagnosis not present

## 2020-07-28 DIAGNOSIS — R0602 Shortness of breath: Secondary | ICD-10-CM | POA: Diagnosis not present

## 2020-07-28 DIAGNOSIS — K7460 Unspecified cirrhosis of liver: Secondary | ICD-10-CM | POA: Diagnosis not present

## 2020-08-21 DIAGNOSIS — Z1159 Encounter for screening for other viral diseases: Secondary | ICD-10-CM | POA: Diagnosis not present

## 2020-08-26 DIAGNOSIS — K3189 Other diseases of stomach and duodenum: Secondary | ICD-10-CM | POA: Diagnosis not present

## 2020-08-26 DIAGNOSIS — K766 Portal hypertension: Secondary | ICD-10-CM | POA: Diagnosis not present

## 2020-08-26 DIAGNOSIS — I8500 Esophageal varices without bleeding: Secondary | ICD-10-CM | POA: Diagnosis not present

## 2020-08-26 DIAGNOSIS — D509 Iron deficiency anemia, unspecified: Secondary | ICD-10-CM | POA: Diagnosis not present

## 2020-09-20 DIAGNOSIS — Z23 Encounter for immunization: Secondary | ICD-10-CM | POA: Diagnosis not present

## 2020-09-26 ENCOUNTER — Other Ambulatory Visit: Payer: Self-pay | Admitting: Gastroenterology

## 2020-09-26 DIAGNOSIS — K7030 Alcoholic cirrhosis of liver without ascites: Secondary | ICD-10-CM

## 2020-10-06 ENCOUNTER — Ambulatory Visit
Admission: RE | Admit: 2020-10-06 | Discharge: 2020-10-06 | Disposition: A | Discharge status: Home / Self Care | Payer: Medicare Other | Source: Ambulatory Visit | Attending: Gastroenterology | Admitting: Gastroenterology

## 2020-10-06 DIAGNOSIS — K7030 Alcoholic cirrhosis of liver without ascites: Secondary | ICD-10-CM

## 2020-10-06 DIAGNOSIS — K760 Fatty (change of) liver, not elsewhere classified: Secondary | ICD-10-CM | POA: Diagnosis not present

## 2020-10-06 DIAGNOSIS — K7689 Other specified diseases of liver: Secondary | ICD-10-CM | POA: Diagnosis not present

## 2020-10-06 DIAGNOSIS — K8020 Calculus of gallbladder without cholecystitis without obstruction: Secondary | ICD-10-CM | POA: Diagnosis not present

## 2020-10-06 DIAGNOSIS — K7460 Unspecified cirrhosis of liver: Secondary | ICD-10-CM | POA: Diagnosis not present

## 2020-10-11 DIAGNOSIS — Z23 Encounter for immunization: Secondary | ICD-10-CM | POA: Diagnosis not present

## 2020-12-14 IMAGING — US IR PARACENTESIS
1 series · 4 of 4 positions shown · non-contrast
Comparison: none

INDICATION: Patient with history of [place] cirrhosis and history of breast cancer.
Ascites. Request for diagnostic and therapeutic paracentesis.

[Series 1: ir (id) (id)/(id)/(id) ir · 4 of 4 slices shown]
[im 1/4]
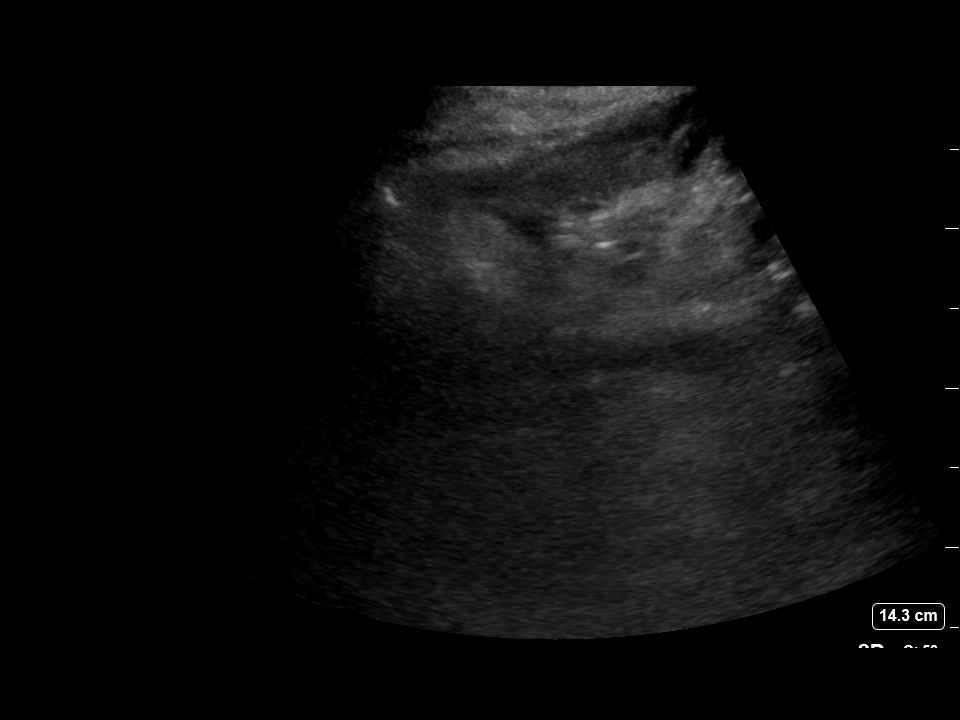
[im 2/4]
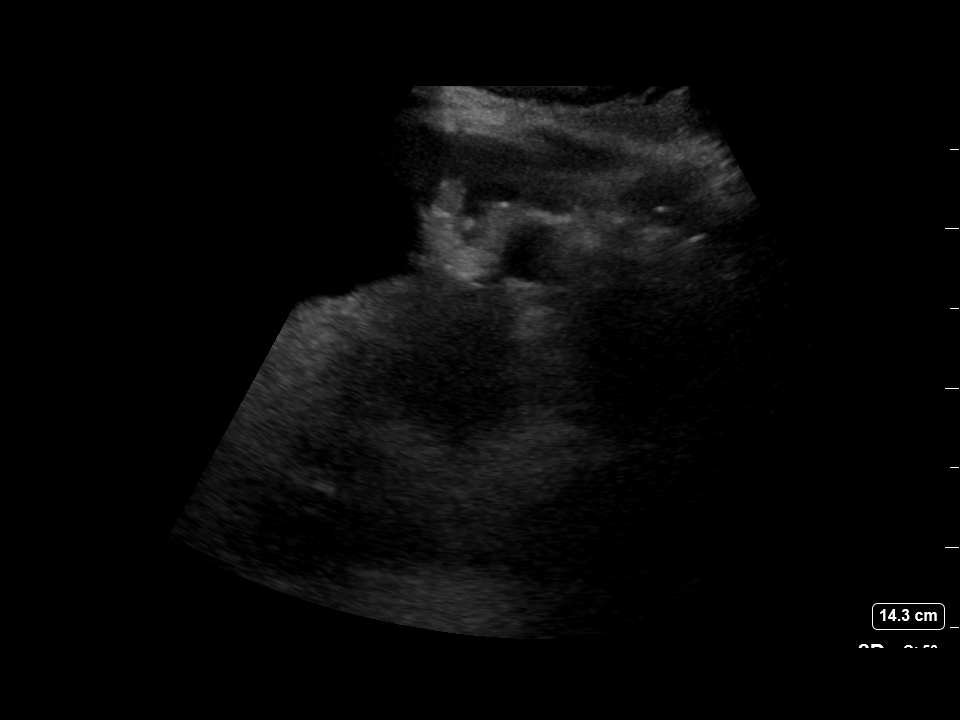
[im 3/4]
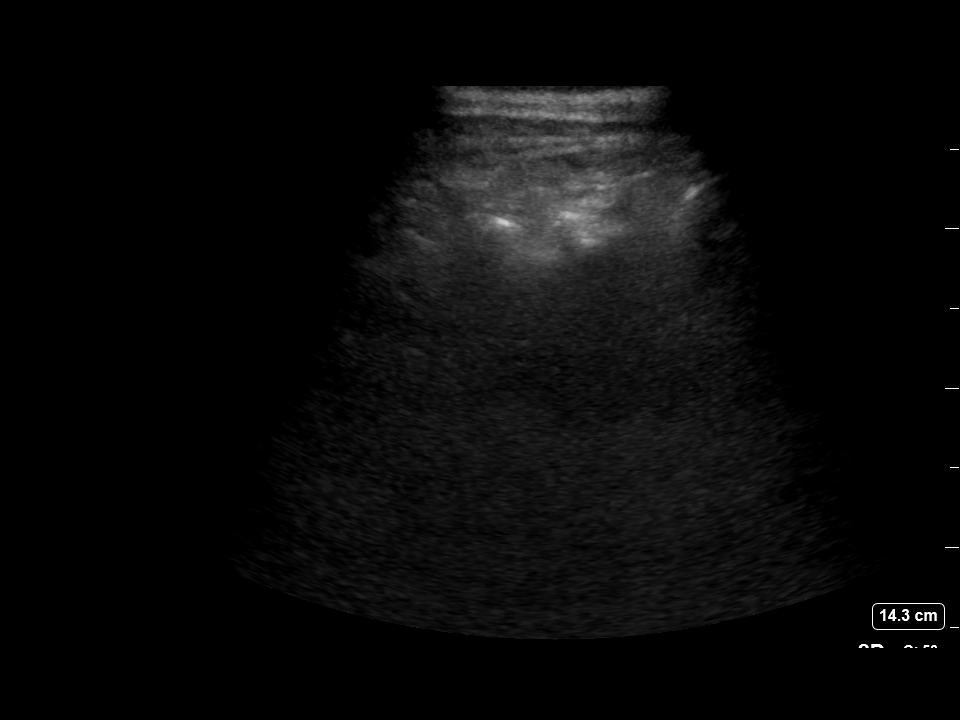
[im 4/4]
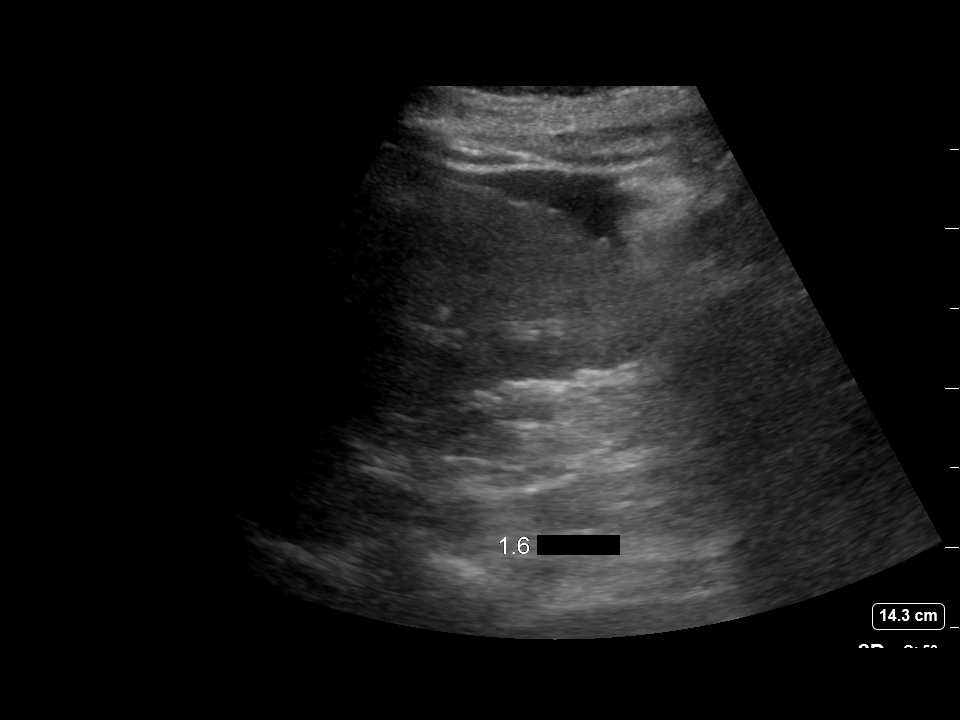

[4 of 4 positions shown; findings below may reference images not displayed]

EXAM:
ULTRASOUND GUIDED PARACENTESIS

MEDICATIONS:
1% lidocaine 10 mL

COMPLICATIONS:
None immediate.

PROCEDURE:
Informed written consent was obtained from the patient after a
discussion of the risks, benefits and alternatives to treatment. A
timeout was performed prior to the initiation of the procedure.

Initial ultrasound scanning demonstrates a small amount of ascites
within the right lower abdominal quadrant. The right lower abdomen
was prepped and draped in the usual sterile fashion. 1% lidocaine
with epinephrine was used for local anesthesia.

Following this, a 19 gauge, 7-cm, Yueh catheter was introduced. An
ultrasound image was saved for documentation purposes. The
paracentesis was performed. The catheter was removed and a dressing
was applied. The patient tolerated the procedure well without
immediate post procedural complication.
FINDINGS: A total of approximately 1.6 L of hazy yellow fluid was removed.
Samples were sent to the laboratory as requested by the clinical
team.
IMPRESSION: Successful ultrasound-guided paracentesis yielding 1.6 liters of
peritoneal fluid.

## 2020-12-25 DIAGNOSIS — H25813 Combined forms of age-related cataract, bilateral: Secondary | ICD-10-CM | POA: Diagnosis not present

## 2020-12-31 DIAGNOSIS — K7460 Unspecified cirrhosis of liver: Secondary | ICD-10-CM | POA: Diagnosis not present

## 2021-02-04 DIAGNOSIS — H2511 Age-related nuclear cataract, right eye: Secondary | ICD-10-CM | POA: Diagnosis not present

## 2021-02-04 DIAGNOSIS — H25812 Combined forms of age-related cataract, left eye: Secondary | ICD-10-CM | POA: Diagnosis not present

## 2021-02-04 DIAGNOSIS — H25011 Cortical age-related cataract, right eye: Secondary | ICD-10-CM | POA: Diagnosis not present

## 2021-02-09 DIAGNOSIS — Z1389 Encounter for screening for other disorder: Secondary | ICD-10-CM | POA: Diagnosis not present

## 2021-02-09 DIAGNOSIS — K7030 Alcoholic cirrhosis of liver without ascites: Secondary | ICD-10-CM | POA: Diagnosis not present

## 2021-02-09 DIAGNOSIS — L989 Disorder of the skin and subcutaneous tissue, unspecified: Secondary | ICD-10-CM | POA: Diagnosis not present

## 2021-02-09 DIAGNOSIS — Z0000 Encounter for general adult medical examination without abnormal findings: Secondary | ICD-10-CM | POA: Diagnosis not present

## 2021-02-09 DIAGNOSIS — E7800 Pure hypercholesterolemia, unspecified: Secondary | ICD-10-CM | POA: Diagnosis not present

## 2021-02-09 DIAGNOSIS — I10 Essential (primary) hypertension: Secondary | ICD-10-CM | POA: Diagnosis not present

## 2021-02-09 DIAGNOSIS — E039 Hypothyroidism, unspecified: Secondary | ICD-10-CM | POA: Diagnosis not present

## 2021-02-11 DIAGNOSIS — H2512 Age-related nuclear cataract, left eye: Secondary | ICD-10-CM | POA: Diagnosis not present

## 2021-02-16 DIAGNOSIS — L820 Inflamed seborrheic keratosis: Secondary | ICD-10-CM | POA: Diagnosis not present

## 2021-02-24 ENCOUNTER — Emergency Department (HOSPITAL_COMMUNITY)
Admission: EM | Admit: 2021-02-24 | Discharge: 2021-02-24 | Disposition: A | Discharge status: Home / Self Care | Payer: Medicare Other | Attending: Emergency Medicine | Admitting: Emergency Medicine

## 2021-02-24 ENCOUNTER — Other Ambulatory Visit: Payer: Self-pay

## 2021-02-24 ENCOUNTER — Encounter (HOSPITAL_COMMUNITY): Payer: Self-pay | Admitting: Emergency Medicine

## 2021-02-24 DIAGNOSIS — Z79899 Other long term (current) drug therapy: Secondary | ICD-10-CM | POA: Diagnosis not present

## 2021-02-24 DIAGNOSIS — Z853 Personal history of malignant neoplasm of breast: Secondary | ICD-10-CM | POA: Diagnosis not present

## 2021-02-24 DIAGNOSIS — K648 Other hemorrhoids: Secondary | ICD-10-CM | POA: Diagnosis not present

## 2021-02-24 DIAGNOSIS — Z87891 Personal history of nicotine dependence: Secondary | ICD-10-CM | POA: Diagnosis not present

## 2021-02-24 DIAGNOSIS — K922 Gastrointestinal hemorrhage, unspecified: Secondary | ICD-10-CM | POA: Insufficient documentation

## 2021-02-24 DIAGNOSIS — K649 Unspecified hemorrhoids: Secondary | ICD-10-CM | POA: Diagnosis not present

## 2021-02-24 DIAGNOSIS — K625 Hemorrhage of anus and rectum: Secondary | ICD-10-CM | POA: Diagnosis present

## 2021-02-24 DIAGNOSIS — I10 Essential (primary) hypertension: Secondary | ICD-10-CM | POA: Diagnosis not present

## 2021-02-24 LAB — CBC
HCT: 40.2 % (ref 36.0–46.0)
Hemoglobin: 13.1 g/dL (ref 12.0–15.0)
MCH: 29.7 pg (ref 26.0–34.0)
MCHC: 32.6 g/dL (ref 30.0–36.0)
MCV: 91.2 fL (ref 80.0–100.0)
Platelets: 161 10*3/uL (ref 150–400)
RBC: 4.41 MIL/uL (ref 3.87–5.11)
RDW: 14.4 % (ref 11.5–15.5)
WBC: 6.3 10*3/uL (ref 4.0–10.5)
nRBC: 0 % (ref 0.0–0.2)

## 2021-02-24 LAB — COMPREHENSIVE METABOLIC PANEL
ALT: 21 U/L (ref 0–44)
AST: 37 U/L (ref 15–41)
Albumin: 3.9 g/dL (ref 3.5–5.0)
Alkaline Phosphatase: 169 U/L — ABNORMAL HIGH (ref 38–126)
Anion gap: 8 (ref 5–15)
BUN: 21 mg/dL (ref 8–23)
CO2: 22 mmol/L (ref 22–32)
Calcium: 9.8 mg/dL (ref 8.9–10.3)
Chloride: 105 mmol/L (ref 98–111)
Creatinine, Ser: 0.91 mg/dL (ref 0.44–1.00)
GFR, Estimated: >60 mL/min (ref >60)
Glucose, Bld: 114 mg/dL — ABNORMAL HIGH (ref 70–99)
Potassium: 4.2 mmol/L (ref 3.5–5.1)
Sodium: 135 mmol/L (ref 135–145)
Total Bilirubin: 0.9 mg/dL (ref 0.3–1.2)
Total Protein: 8.4 g/dL — ABNORMAL HIGH (ref 6.5–8.1)

## 2021-02-24 LAB — TYPE AND SCREEN
ABO/RH(D): O POS
Antibody Screen: NEGATIVE

## 2021-02-24 LAB — PROTIME-INR
INR: 1.3 — ABNORMAL HIGH (ref 0.8–1.2)
Prothrombin Time: 15.6 seconds — ABNORMAL HIGH (ref 11.4–15.2)

## 2021-02-24 LAB — POC OCCULT BLOOD, ED: Fecal Occult Bld: POSITIVE — AB

## 2021-02-24 MED ORDER — HYDROCORTISONE ACETATE 25 MG RE SUPP: 25.0000 mg | Freq: Two times a day (BID) | RECTAL | 0 refills | Status: DC

## 2021-02-24 MED ORDER — HYDROCORTISONE ACETATE 25 MG RE SUPP
25.0000 mg | Freq: Once | RECTAL | Status: DC
  Filled 2021-02-24: qty 1

## 2021-02-24 NOTE — ED Triage Notes (Signed)
C/o rectal bleeding that started this morning.  Denies any other symptoms.

## 2021-02-24 NOTE — ED Notes (Signed)
Pt d/c home per MD order. Discharge summary reviewed with pt, pt verbalizes understanding , No s/s of acute distress noted , ambulatory off unit. Discharged home with husband.

## 2021-02-24 NOTE — ED Provider Notes (Signed)
[facility] EMERGENCY DEPARTMENT Provider Note   CSN: [id number] Arrival date & time: 02/24/21  0865     History Chief Complaint  Patient presents with  . Rectal Bleeding    Joyce Griffin is a 70 y.o. female.  HPI  Pt with hx of iron deficiency anemia, nonalcoholic hepatic steatosis, cirrhosis, and esophageal varices follows with Dr. Michail Sermon with [facility] and Dr. Brigitte Pulse with [facility] Transplant team.  Recent colonoscopy that showed "a couple polyps" and and EGD with non bleeding varices. Pt reports around 2:30 AM she went to the bathroom and there was no blood however when she returned to the bathroom around 3:30-4AM there was blood on her pad. She wiped there was blood there also. Denies vaginal bleeding, increased shortness of breath, new headache, fatigue, weakness, hematemesis, vomiting, recent illness. No similar sx previously. States her GI doctor told her that if she ever had bleeding to go to the ED.      Past Medical History:  Diagnosis Date  . Breast cancer ([place]) 2004   left breast  . Hypertension   . NASH (nonalcoholic steatohepatitis)     There are no problems to display for this patient.   Past Surgical History:  Procedure Laterality Date  . ABDOMINOPLASTY  2004  . APPENDECTOMY    . AUGMENTATION MAMMAPLASTY Left 2004   following mastectomy  . BREAST LUMPECTOMY  2004 X 2  . CATARACT EXTRACTION    . IR PARACENTESIS  04/19/2019  . MASTECTOMY, PARTIAL Left 2012     OB History    Gravida  1   Para  1   Term  1   Preterm      AB      Living  1     SAB      IAB      Ectopic      Multiple      Live Births              Family History  Problem Relation Age of Onset  . COPD Father     Social History   Tobacco Use  . Smoking status: Former Smoker    Packs/day: 0.50    Years: 25.00    Pack years: 12.50    Quit date: 02/29/1992    Years since quitting: 29.0  . Smokeless tobacco: Never Used  Substance Use Topics  .  Alcohol use: Yes    Alcohol/week: 4.0 standard drinks    Types: 4 Standard drinks or equivalent per week    Comment: occ  . Drug use: No    Home Medications Prior to Admission medications   Medication Sig Start Date End Date Taking? Authorizing Provider  hydrocortisone (ANUSOL-HC) 25 MG suppository Place 1 suppository (25 mg total) rectally 2 (two) times daily. 02/24/21  Yes Inioluwa Boulay, DO  fluticasone (FLONASE) 50 MCG/ACT nasal spray Place 1 spray into both nostrils as needed. 01/24/14   [provider]  metoprolol tartrate (LOPRESSOR) 25 MG tablet Take 1 tablet by mouth daily. 04/08/14   [provider]    Allergies    Codeine and Neosporin [neomycin-bacitracin zn-polymyx]  Review of Systems   Review of Systems  Constitutional: Negative for fatigue and fever.  HENT: Negative for congestion, rhinorrhea and sore throat.   Eyes: Negative for visual disturbance.  Respiratory: Negative for cough and shortness of breath.   Cardiovascular: Negative for chest pain.  Gastrointestinal: Positive for blood in stool. Negative for abdominal pain,  diarrhea, nausea and vomiting.  Genitourinary: Negative for dysuria.  Musculoskeletal: Negative for back pain.  Skin: Negative for color change.  Neurological: Negative for dizziness and light-headedness. Headaches: chronic, unchanged.  All other systems reviewed and are negative.   Physical Exam Updated Vital Signs BP 130/73   Pulse 77   Temp 97.7 F (36.5 C)   Resp 13   LMP 07/14/2003   SpO2 100%   Physical Exam Vitals and nursing note reviewed.  Constitutional:      General: She is not in acute distress.    Appearance: Normal appearance. She is well-developed. She is not ill-appearing.     Comments: Joking with partner  HENT:     Head: Normocephalic and atraumatic.     Nose: Nose normal.     Mouth/Throat:     Mouth: Mucous membranes are moist.     Pharynx: Oropharynx is clear. No posterior oropharyngeal  erythema.  Eyes:     General: No scleral icterus.    Extraocular Movements: Extraocular movements intact.     Conjunctiva/sclera: Conjunctivae normal.     Pupils: Pupils are equal, round, and reactive to light.  Cardiovascular:     Rate and Rhythm: Normal rate and regular rhythm.     Pulses: Normal pulses.     Heart sounds: Normal heart sounds. No murmur heard.   Pulmonary:     Effort: Pulmonary effort is normal. No respiratory distress.     Breath sounds: Normal breath sounds.  Abdominal:     General: Bowel sounds are normal. There is no distension.     Palpations: Abdomen is soft. There is no mass.     Tenderness: There is no abdominal tenderness. There is no guarding or rebound.     Comments: No appreciable ascites   Genitourinary:    Rectum: Guaiac result positive. External hemorrhoid present. No mass. Normal anal tone.  Musculoskeletal:        General: Normal range of motion.     Cervical back: Normal range of motion and neck supple. No tenderness.     Right lower leg: No edema.     Left lower leg: No edema.  Skin:    General: Skin is warm and dry.     Capillary Refill: Capillary refill takes less than 2 seconds.  Neurological:     Mental Status: She is alert and oriented to person, place, and time. Mental status is at baseline.  Psychiatric:        Mood and Affect: Mood normal.        Behavior: Behavior normal.     ED Results / Procedures / Treatments   Labs (all labs ordered are listed, but only abnormal results are displayed) Labs Reviewed  COMPREHENSIVE METABOLIC PANEL - Abnormal; Notable for the following components:      Result Value   Glucose, Bld 114 (*)    Total Protein 8.4 (*)    Alkaline Phosphatase 169 (*)    All other components within normal limits  PROTIME-INR - Abnormal; Notable for the following components:   Prothrombin Time 15.6 (*)    INR 1.3 (*)    All other components within normal limits  POC OCCULT BLOOD, ED - Abnormal; Notable for the  following components:   Fecal Occult Bld POSITIVE (*)    All other components within normal limits  CBC  TYPE AND SCREEN  ABO/RH    EKG None  Radiology No results found.  Procedures Procedures   Medications Ordered in ED  Medications  hydrocortisone (ANUSOL-HC) suppository 25 mg (has no administration in time range)    ED Course  I have reviewed the triage vital signs and the nursing notes.  Pertinent labs & imaging results that were available during my care of the patient were reviewed by me and considered in my medical decision making (see chart for details).  9:28 AM Paged on call physician for [facility].   9:45 AM Spoke with Dr. Paulita Fujita with GI. Pt with colonoscopy in 2019 and mild internal hemorrhoids, polpys were not pre-cancerous. He did not recommend admission as patient's ;abs are stable. She has likely has bleeding from her internal hemorrhoids. Recommended hemorrhoid suppositories.      MDM Rules/Calculators/A&P                          Pt is a  70 yo female with hx of HTN, NASH, cirrhosis, Grade 2 esophogeal varices (non-bleeding), hypercholesterolemia and hypothryoidism that presents for rectal bleeding that started this morning. Per chart review, hemoglobin 9.8-13.4 over the last year. Today 13.1. Fecal occult positive. INR 1.3. MELDNa Score 13.  On exam, pt has no external hemorrhoids and no palpable rectal masses.   Consulted with Dr. Paulita Fujita with [facility] who doubts pt has a diverticular bleed and likely has internal hemorrhoids. Recommended hemorrhoid suppositories and outpatient follow up with GI. Hydrocortisone suppository ordered.     Final Clinical Impression(s) / ED Diagnoses Final diagnoses:  Lower GI bleed  Hemorrhoids, unspecified hemorrhoid type    Rx / DC Orders ED Discharge Orders         Ordered    hydrocortisone (ANUSOL-HC) 25 MG suppository  2 times daily        02/24/21 0959           Lyndee Hensen, DO 02/24/21 1026     Elnora Morrison, MD 02/24/21 1620

## 2021-02-24 NOTE — Discharge Instructions (Addendum)
You were seen at the [facility] Emergency Department for rectal bleeding. I discussed your bleeding with the on call physician for [facility] who reviewed your records. He believed your slow bleeding was likely from internal hemorrhoids as internal hemorrhoids were seen on your colonoscopy in 2019. Your bleeding labs were (hemoglobin and platelet count) were normal which is reassuring.  For your hemorrhoids: Try to increase fiber intake with a goal of 25 grams a day. Consider purchasing Metamucil fiber supplements (start daily and gradually increase to 3 times daily). Stop by the pharmacy to pick up your suppositories.   Be sure to follow up with Dr. Michail Sermon.   If bleeding becomes brisk or you feel lightheaded, have chest pain or feel faint, return to the emergency department.  Take Care,  Dr Rushie [facility] Emergency Department

## 2021-03-31 DIAGNOSIS — I10 Essential (primary) hypertension: Secondary | ICD-10-CM | POA: Diagnosis not present

## 2021-03-31 DIAGNOSIS — M8580 Other specified disorders of bone density and structure, unspecified site: Secondary | ICD-10-CM | POA: Diagnosis not present

## 2021-03-31 DIAGNOSIS — E7800 Pure hypercholesterolemia, unspecified: Secondary | ICD-10-CM | POA: Diagnosis not present

## 2021-03-31 DIAGNOSIS — C50919 Malignant neoplasm of unspecified site of unspecified female breast: Secondary | ICD-10-CM | POA: Diagnosis not present

## 2021-03-31 DIAGNOSIS — E039 Hypothyroidism, unspecified: Secondary | ICD-10-CM | POA: Diagnosis not present

## 2021-04-03 ENCOUNTER — Other Ambulatory Visit: Payer: Self-pay | Admitting: Gastroenterology

## 2021-04-03 DIAGNOSIS — K7030 Alcoholic cirrhosis of liver without ascites: Secondary | ICD-10-CM

## 2021-04-21 ENCOUNTER — Ambulatory Visit
Admission: RE | Admit: 2021-04-21 | Discharge: 2021-04-21 | Disposition: A | Discharge status: Home / Self Care | Payer: Medicare Other | Source: Ambulatory Visit | Attending: Gastroenterology | Admitting: Gastroenterology

## 2021-04-21 DIAGNOSIS — K7030 Alcoholic cirrhosis of liver without ascites: Secondary | ICD-10-CM | POA: Diagnosis not present

## 2021-04-21 DIAGNOSIS — R161 Splenomegaly, not elsewhere classified: Secondary | ICD-10-CM | POA: Diagnosis not present

## 2021-04-21 DIAGNOSIS — N289 Disorder of kidney and ureter, unspecified: Secondary | ICD-10-CM | POA: Diagnosis not present

## 2021-04-21 DIAGNOSIS — K8020 Calculus of gallbladder without cholecystitis without obstruction: Secondary | ICD-10-CM | POA: Diagnosis not present

## 2021-04-24 ENCOUNTER — Other Ambulatory Visit: Payer: Self-pay | Admitting: Gastroenterology

## 2021-04-24 DIAGNOSIS — N289 Disorder of kidney and ureter, unspecified: Secondary | ICD-10-CM

## 2021-05-06 ENCOUNTER — Ambulatory Visit
Admission: RE | Admit: 2021-05-06 | Discharge: 2021-05-06 | Disposition: A | Discharge status: Home / Self Care | Payer: Medicare Other | Source: Ambulatory Visit | Attending: Gastroenterology | Admitting: Gastroenterology

## 2021-05-06 DIAGNOSIS — K8020 Calculus of gallbladder without cholecystitis without obstruction: Secondary | ICD-10-CM | POA: Diagnosis not present

## 2021-05-06 DIAGNOSIS — N289 Disorder of kidney and ureter, unspecified: Secondary | ICD-10-CM

## 2021-05-06 DIAGNOSIS — K7469 Other cirrhosis of liver: Secondary | ICD-10-CM | POA: Diagnosis not present

## 2021-05-06 DIAGNOSIS — K766 Portal hypertension: Secondary | ICD-10-CM | POA: Diagnosis not present

## 2021-05-06 DIAGNOSIS — Z87448 Personal history of other diseases of urinary system: Secondary | ICD-10-CM | POA: Diagnosis not present

## 2021-05-06 MED ORDER — IOPAMIDOL (ISOVUE-300) INJECTION 61%
100.0000 mL | Freq: Once | INTRAVENOUS | Status: AC | PRN
  Administered 2021-05-06: 100 mL via INTRAVENOUS

## 2021-05-18 DIAGNOSIS — R978 Other abnormal tumor markers: Secondary | ICD-10-CM | POA: Diagnosis not present

## 2021-05-18 DIAGNOSIS — N9489 Other specified conditions associated with female genital organs and menstrual cycle: Secondary | ICD-10-CM | POA: Diagnosis not present

## 2021-05-19 DIAGNOSIS — N9489 Other specified conditions associated with female genital organs and menstrual cycle: Secondary | ICD-10-CM | POA: Diagnosis not present

## 2021-05-20 ENCOUNTER — Encounter: Payer: Self-pay | Admitting: Gynecologic Oncology

## 2021-05-22 ENCOUNTER — Other Ambulatory Visit: Payer: Self-pay

## 2021-05-22 ENCOUNTER — Inpatient Hospital Stay: Payer: Medicare Other | Attending: Gynecologic Oncology | Admitting: Gynecologic Oncology

## 2021-05-22 ENCOUNTER — Encounter: Payer: Self-pay | Admitting: Gynecologic Oncology

## 2021-05-22 ENCOUNTER — Telehealth: Payer: Self-pay | Admitting: Gynecologic Oncology

## 2021-05-22 VITALS — BP 125/73 | HR 70 | Temp 97.4°F | Resp 18 | Ht 66.0 in | Wt 121.4 lb

## 2021-05-22 DIAGNOSIS — C50919 Malignant neoplasm of unspecified site of unspecified female breast: Secondary | ICD-10-CM | POA: Insufficient documentation

## 2021-05-22 DIAGNOSIS — K769 Liver disease, unspecified: Secondary | ICD-10-CM | POA: Insufficient documentation

## 2021-05-22 DIAGNOSIS — Z8041 Family history of malignant neoplasm of ovary: Secondary | ICD-10-CM | POA: Diagnosis not present

## 2021-05-22 DIAGNOSIS — D3912 Neoplasm of uncertain behavior of left ovary: Secondary | ICD-10-CM | POA: Diagnosis not present

## 2021-05-22 DIAGNOSIS — K7581 Nonalcoholic steatohepatitis (NASH): Secondary | ICD-10-CM | POA: Insufficient documentation

## 2021-05-22 DIAGNOSIS — E079 Disorder of thyroid, unspecified: Secondary | ICD-10-CM | POA: Insufficient documentation

## 2021-05-22 DIAGNOSIS — R188 Other ascites: Secondary | ICD-10-CM | POA: Insufficient documentation

## 2021-05-22 DIAGNOSIS — Z79899 Other long term (current) drug therapy: Secondary | ICD-10-CM | POA: Insufficient documentation

## 2021-05-22 DIAGNOSIS — Z9012 Acquired absence of left breast and nipple: Secondary | ICD-10-CM | POA: Insufficient documentation

## 2021-05-22 DIAGNOSIS — N9489 Other specified conditions associated with female genital organs and menstrual cycle: Secondary | ICD-10-CM

## 2021-05-22 DIAGNOSIS — I10 Essential (primary) hypertension: Secondary | ICD-10-CM | POA: Insufficient documentation

## 2021-05-22 DIAGNOSIS — D3911 Neoplasm of uncertain behavior of right ovary: Secondary | ICD-10-CM | POA: Insufficient documentation

## 2021-05-22 DIAGNOSIS — Z853 Personal history of malignant neoplasm of breast: Secondary | ICD-10-CM | POA: Diagnosis not present

## 2021-05-22 DIAGNOSIS — K7031 Alcoholic cirrhosis of liver with ascites: Secondary | ICD-10-CM | POA: Insufficient documentation

## 2021-05-22 DIAGNOSIS — E7800 Pure hypercholesterolemia, unspecified: Secondary | ICD-10-CM | POA: Diagnosis not present

## 2021-05-22 DIAGNOSIS — I8500 Esophageal varices without bleeding: Secondary | ICD-10-CM | POA: Insufficient documentation

## 2021-05-22 DIAGNOSIS — M8580 Other specified disorders of bone density and structure, unspecified site: Secondary | ICD-10-CM | POA: Insufficient documentation

## 2021-05-22 DIAGNOSIS — K7030 Alcoholic cirrhosis of liver without ascites: Secondary | ICD-10-CM | POA: Insufficient documentation

## 2021-05-22 DIAGNOSIS — F419 Anxiety disorder, unspecified: Secondary | ICD-10-CM | POA: Insufficient documentation

## 2021-05-22 HISTORY — DX: Other specified conditions associated with female genital organs and menstrual cycle: N94.89

## 2021-05-22 NOTE — H&P (View-Only) (Signed)
GYNECOLOGIC ONCOLOGY NEW PATIENT CONSULTATION   Patient Name: Joyce Griffin  Patient Age: 70 y.o. Date of Service: 05/22/2021 Referring Provider: Christophe Louis MD  Primary Care Provider: Seward Carol, MD Consulting Provider: Jeral Pinch, MD   Assessment/Plan:  Postmenopausal patient with bilateral complex adnexal masses.  I discussed in detail with the patient findings on recent CT scan and pelvic ultrasounds which are new from prior imaging less than 2 years ago.  Imaging most concerning for bilateral ovarian masses, mostly cystic in appearance on CT scan.  We discussed that there are some findings on ultrasound that raise concern for malignancy.  Her CA-125 was within normal limits; however, we discussed the limitations of this test.  Specifically in early stage disease, Ca1 25 can be normal and up to 50% of patients.  Additionally, the patient's family history of ovarian cancer, with both a maternal grandmother and cousin with ovarian cancer, as well as her own personal history of breast cancer raises my concern for a malignant process.  We discussed that the only definitive way to make a diagnosis is through surgery.  While there are occasionally times where we perform a percutaneous biopsy, in the setting of no evidence of metastatic disease in the abdomen or pelvis, I would not recommend percutaneous biopsy of the either mass.  I would recommend that we move forward with surgery to include bilateral salpingo-oophorectomy, excision of pelvic masses, and likely total hysterectomy.  We discussed plan for intraoperative frozen section.  If malignancy identified, then surgical staging would be performed as indicated which may include lymph node sampling and omentectomy.  Given her history significant for cirrhosis, I will reach out to her hepatologist at [facility] to ask about any optimization needed before surgery, presurgical testing, and location for surgery.  Patient and I discussed that given  cirrhosis and known esophageal varices, there is a chance that the recommendation will be for surgery at a tertiary care center like [facility].  Otherwise, we will move forward with surgery at [facility].  If this is the case, then we will schedule surgery in the next couple of weeks and have the patient return for a preoperative visit here.  If the patient is having surgery at [facility], then we will request time for surgery as well as preoperative appointment.  A copy of this note was sent to the patient's referring provider.   65 minutes of total time was spent for this patient encounter, including preparation, face-to-face counseling with the patient and coordination of care, and documentation of the encounter.  Jeral Pinch, MD  Division of Gynecologic Oncology  Department of Obstetrics and Gynecology  [facility]  ___________________________________________  Chief Complaint: Chief Complaint  Patient presents with   Adnexal mass    History of Present Illness:  Joyce Griffin is a 70 y.o. y.o. female who is seen in consultation at the request of Dr. Landry Mellow for an evaluation of bilateral adnexal masses.  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  Her cirrhosis has been complicated by ascites as well as nonbleeding esophageal varices.  He has not used alcohol since 04/2019.  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  On recent ultrasound in early May, a new small mass was seen in the left kidney with further imaging work-up recommended.  Thus, she underwent CT of the abdomen and pelvis at the end of May  with findings of bilateral multicystic adnexal masses.  No definitive evidence of metastatic disease was noted.  The patient reports being in her general state of health.  She denies any abdominal or pelvic pain.  She has occasional twinges that she attributes to her  prior TRAM flap, done at the time of her breast cancer diagnosis and treatment.  She has some sensitivity around her prior incision for this.  She endorses no change recently in her appetite although notes that her appetite is never good.  More recently, she does not feel like eating a lot and she attributes this in some part to dental work that she has had done.  She works on increasing her protein intake given the fact that she does not eat meat.  She drinks protein shakes and eats protein bars.  She has very occasional nausea, denies any emesis.   She reports regular bowel function.  She has known internal hemorrhoids several months although has not had any bleeding from these in the last few months.  She has increased urinary frequency but attributes this to drinking a lot of water and being on a diuretic.  Her past medical history is notable for left breast cancer treated in 2004 with a left total mastectomy and TRAM reconstruction.  She has not had genetic testing.  She has well-controlled hypertension which improved significantly after she quit drinking.  She has occasional shortness of breath with exertion although this has improved significantly.  She denies any chest pain.  Her GYN history is notable for hysteroscopy with D&C, partial resection of submucosal fibroids in 04/2011.  Her family history is significant for ovarian cancer in her maternal grandmother as well as in a maternal first cousin.  The patient lives in [place] with a roommate.  She is not currently working.  PAST MEDICAL HISTORY:  Past Medical History:  Diagnosis Date   Anxiety    Breast cancer ([place]) 2004   left breast   Cirrhosis ([place])    Esophageal varices ([place])    Hypercholesteremia    Hypertension    NASH (nonalcoholic steatohepatitis)    Thyroid disease      PAST SURGICAL HISTORY:  Past Surgical History:  Procedure Laterality Date   ABDOMINOPLASTY  2004   APPENDECTOMY     AUGMENTATION MAMMAPLASTY Left 2004    following mastectomy   BREAST LUMPECTOMY  2004 X 2   CATARACT EXTRACTION     right eye 02/04/21, left eye 02-2021   COLONOSCOPY     2005,2019,   ESOPHAGOGASTRODUODENOSCOPY (EGD) WITH ESOPHAGEAL DILATION     2020and 2021   IR PARACENTESIS  04/19/2019   MASTECTOMY, PARTIAL Left 2012    OB/GYN HISTORY:  OB History  Gravida Para Term Preterm AB Living  1 1 1     1   SAB IAB Ectopic Multiple Live Births               # Outcome Date GA Lbr Len/2nd Weight Sex Delivery Anes PTL Lv  1 Term 55    M Vag-Spont       Patient's last menstrual period was 07/14/2003.  Age at menarche: Does not remember Age at menopause: Approximately 70 years old Hx of HRT: Denies, was on tamoxifen in the setting of her prior breast cancer history Hx of STDs: Denies Last pap: Per patient, 2019.  Within our system, patient had one in 2015 History of abnormal pap smears: Denies  SCREENING STUDIES:  Last mammogram: 2017  Last colonoscopy: 2019  MEDICATIONS: Outpatient Encounter Medications as of 05/22/2021  Medication Sig   carvedilol (COREG) 6.25 MG tablet Take 6.25 mg by mouth daily.   ferrous sulfate 325 (65 FE) MG tablet Take 325 mg by mouth daily.   furosemide (LASIX) 40 MG tablet Take 20 mg by mouth daily.   levothyroxine (SYNTHROID) 50 MCG tablet Take 50 mcg by mouth every morning.   spironolactone (ALDACTONE) 100 MG tablet Take 50 mg by mouth daily.   [DISCONTINUED] fluticasone (FLONASE) 50 MCG/ACT nasal spray Place 1 spray into both nostrils as needed.   [DISCONTINUED] hydrocortisone (ANUSOL-HC) 25 MG suppository Place 1 suppository (25 mg total) rectally 2 (two) times daily.   [DISCONTINUED] metoprolol tartrate (LOPRESSOR) 25 MG tablet Take 1 tablet by mouth daily.   No facility-administered encounter medications on file as of 05/22/2021.    ALLERGIES:  Allergies  Allergen Reactions   Lexapro [Escitalopram] Other (See Comments)    Dazed /Lupy   Bacitracin-Neomycin-Polymyxin Rash    Benzalkonium Chloride Rash   Codeine Other (See Comments)    Unknown    Nickel Rash     FAMILY HISTORY:  Family History  Problem Relation Age of Onset   COPD Father    Ovarian cancer Maternal Grandmother    Ovarian cancer Cousin    Colon cancer Neg Hx    Breast cancer Neg Hx    Pancreatic cancer Neg Hx    Prostate cancer Neg Hx      SOCIAL HISTORY:  Social Connections: Not on file    REVIEW OF SYSTEMS:  Denies appetite changes, fevers, chills, fatigue, unexplained weight changes. Denies hearing loss, neck lumps or masses, mouth sores, ringing in ears or voice changes. Denies cough or wheezing.  Denies shortness of breath. Denies chest pain or palpitations. Denies leg swelling. Denies abdominal distention, pain, blood in stools, constipation, diarrhea, nausea, vomiting, or early satiety. Denies pain with intercourse, dysuria, frequency, hematuria or incontinence. Denies hot flashes, pelvic pain, vaginal bleeding or vaginal discharge.   Denies joint pain, back pain or muscle pain/cramps. Denies itching, rash, or wounds. Denies dizziness, headaches, numbness or seizures. Denies swollen lymph nodes or glands, denies easy bruising or bleeding. Denies anxiety, depression, confusion, or decreased concentration.  Physical Exam:  Vital Signs for this encounter:  Blood pressure 125/73, pulse 70, temperature (!) 97.4 F (36.3 C), temperature source Tympanic, resp. rate 18, height 5' 6"  (1.676 m), weight 121 lb 6.4 oz (55.1 kg), last menstrual period 07/14/2003, SpO2 100 %. Body mass index is 19.59 kg/m. General: Alert, oriented, no acute distress.  HEENT: Normocephalic, atraumatic. Sclera anicteric.  Chest: Clear to auscultation bilaterally. No wheezes, rhonchi, or rales. Cardiovascular: Regular rate and rhythm, no murmurs, rubs, or gallops.  Abdomen: Normoactive bowel sounds. Soft, nondistended, nontender to palpation. No masses or hepatosplenomegaly appreciated.  Mild fullness  appreciated in the pelvis.  No palpable fluid wave.  Extremities: Grossly normal range of motion. Warm, well perfused. No edema bilaterally.  Skin: No rashes or lesions.  Lymphatics: No cervical, supraclavicular, or inguinal adenopathy.  GU:  Normal external female genitalia. No lesions. No discharge or bleeding.             Bladder/urethra:  No lesions or masses, well supported bladder             Vagina: Moderately atrophic, no lesions or masses.             Cervix: Normal appearing, no lesions.  Atrophic.  Uterus/adnexa: Small, mobile, no parametrial involvement or nodularity.  Uterus moves in conjunction with masses, 1 appreciated within the cul-de-sac and smooth, no nodularity.  With abdominal hand, these can be appreciated within several centimeters of the umbilicus.  Rectal: No rectal involvement, no nodularity appreciated.  LABORATORY AND RADIOLOGIC DATA:  Outside medical records were reviewed to synthesize the above history, along with the history and physical obtained during the visit.   Lab Results  Component Value Date   WBC 6.3 02/24/2021   HGB 13.1 02/24/2021   HCT 40.2 02/24/2021   PLT 161 02/24/2021   GLUCOSE 114 (H) 02/24/2021   CHOL 251 (H) 04/18/2014   TRIG 158 (H) 04/18/2014   HDL 51 04/18/2014   LDLCALC 168 (H) 04/18/2014   ALT 21 02/24/2021   AST 37 02/24/2021   NA 135 02/24/2021   K 4.2 02/24/2021   CL 105 02/24/2021   CREATININE 0.91 02/24/2021   BUN 21 02/24/2021   CO2 22 02/24/2021   TSH 2.063 04/18/2014   INR 1.3 (H) 02/24/2021   Albumin: 3.9  CA-125: 18.2 on 6/6  Pelvic ultrasound exam at [place] on 05/19/2021: Uterus measures 5.7 x 2.5 x 4 cm with an endometrial lining of 1.6 cm. Neither adnexa is seen.  Large complex cystic multiseptated mass in the pelvis that extends from the right to the left adnexa.  Right adnexal portion measures 8.9 x 7.9 x 4.4 cm and left 9 x 7 x 5.5 cm.  CT A/P 05/06/2021: IMPRESSION: 1. There are new  multicystic masses arising from the bilateral ovaries, overall conglomerate of this lesion measuring 9.8 x 9.1 cm. The left ovarian component measures approximately 6.5 x 6.3 cm and the right ovarian component measures approximately 7.4 x 3.5 cm. Findings are highly concerning for ovarian malignancy. 2. No evidence of metastatic disease in the abdomen or pelvis. 3. There is a hypodense lesion of the lateral midportion of the left kidney measuring approximately 4-5 mm, which is low-attenuation relative to the renal cortex and medulla, although too small to reliably measure fat attenuation. This corresponds to finding of prior ultrasound and is consistent with a small, definitively benign angiomyolipoma. No further follow-up or characterization is required. 4. Stigmata of cirrhosis and portal hypertension, including gastroesophageal varices. Trace perihepatic and perisplenic ascites. 5. Cholelithiasis with mild gallbladder wall thickening, nonspecific in the setting of ascites.  CT A/P 03/2020: IMPRESSION: Evidence of cirrhosis with moderate ascites. Mild periesophageal varices.   Mild wall thickening of the ascending colon which may be secondary to patient's liver disease although could be seen with mild acute colitis of infectious or inflammatory nature.   Gallbladder wall thickening likely related to patient's ascites.   Aortic Atherosclerosis (ICD10-I70.0). Atherosclerotic coronary artery disease.   Several possible small uterine fibroids.

## 2021-05-22 NOTE — Telephone Encounter (Signed)
Called the patient to discuss my conversation with her [facility] hepatologist.  No answer.  Per our messages, he feels that she is fairly compensated currently and likely optimized for surgery.  Her largest risk would be from bleeding from intra-abdominal varices.  She should remain on beta-blocker.  He also made recommendations not to overcorrect her INR, to minimize perioperative narcotics to avoid hepatic encephalopathy, and if the patient needed significant amounts of IV fluids, 25% albumin rather than boluses of normal saline would be indicated.  In terms of her surgical risk, he quoted that for laparoscopic surgery, her 30-day mortality is approximately 0.3% and 90-day decompensation risk is 7.3%.  For open surgery, through a 30-day mortality would be 1.2% and 90-day decompensation risk is 12.7%.  I think it is reasonable to move forward with scheduling surgery at [facility].  We will plan to start out laparoscopically and if low risk for malignancy at time of initial survey, we will proceed robotically.  Otherwise may need conversion to open.

## 2021-05-22 NOTE — Patient Instructions (Addendum)
It was a pleasure meeting you today.  I will reach out to your other doctors to help determine if you need any additional work-up before major surgery. We will also discuss where it is safest and best to plan to perform your surgery given your medical history.  As we discussed today, there appear to be masses on both of your ovaries.  This was a new from your CT scan a couple of years ago.  We cannot make a diagnosis based on your CT scan and ultrasound though.  It is overall reassuring that your CA-125 (lab test that is frequently referred to as the ovarian cancer marker) was normal, but this is also not diagnostic either.  There is a good percentage of patients who have ovarian cancer and have a normal blood test.  I am recommending that we move forward with scheduling surgery to get a diagnosis.  I will reach out to your other doctors, specifically your liver doctor, to make sure that if there is additional testing or procedures that would make surgery safer for you, that we get this done.  I also want to make sure that we are doing surgery in the safest place, whether that is here at [facility] or at [facility].  Once I have received this information back, I will give you a call and we will move forward with getting surgery scheduled.  Plan will be at minimum to proceed with bilateral salpingooophorectomy (that means removal of your fallopian tubes and ovaries).  We also discussed the role of hysterectomy and biopsies of other places if cancer is identified where cancer may have spread to.

## 2021-05-22 NOTE — Progress Notes (Signed)
GYNECOLOGIC ONCOLOGY NEW PATIENT CONSULTATION   Patient Name: Joyce Griffin  Patient Age: 70 y.o. Date of Service: 05/22/2021 Referring Provider: Christophe Louis MD  Primary Care Provider: Seward Carol, MD Consulting Provider: Jeral Pinch, MD   Assessment/Plan:  Postmenopausal patient with bilateral complex adnexal masses.  I discussed in detail with the patient findings on recent CT scan and pelvic ultrasounds which are new from prior imaging less than 2 years ago.  Imaging most concerning for bilateral ovarian masses, mostly cystic in appearance on CT scan.  We discussed that there are some findings on ultrasound that raise concern for malignancy.  Her CA-125 was within normal limits; however, we discussed the limitations of this test.  Specifically in early stage disease, Ca1 25 can be normal and up to 50% of patients.  Additionally, the patient's family history of ovarian cancer, with both a maternal grandmother and cousin with ovarian cancer, as well as her own personal history of breast cancer raises my concern for a malignant process.  We discussed that the only definitive way to make a diagnosis is through surgery.  While there are occasionally times where we perform a percutaneous biopsy, in the setting of no evidence of metastatic disease in the abdomen or pelvis, I would not recommend percutaneous biopsy of the either mass.  I would recommend that we move forward with surgery to include bilateral salpingo-oophorectomy, excision of pelvic masses, and likely total hysterectomy.  We discussed plan for intraoperative frozen section.  If malignancy identified, then surgical staging would be performed as indicated which may include lymph node sampling and omentectomy.  Given her history significant for cirrhosis, I will reach out to her hepatologist at [facility] to ask about any optimization needed before surgery, presurgical testing, and location for surgery.  Patient and I discussed that given  cirrhosis and known esophageal varices, there is a chance that the recommendation will be for surgery at a tertiary care center like [facility].  Otherwise, we will move forward with surgery at [facility].  If this is the case, then we will schedule surgery in the next couple of weeks and have the patient return for a preoperative visit here.  If the patient is having surgery at [facility], then we will request time for surgery as well as preoperative appointment.  A copy of this note was sent to the patient's referring provider.   65 minutes of total time was spent for this patient encounter, including preparation, face-to-face counseling with the patient and coordination of care, and documentation of the encounter.  Joyce Pinch, MD  Division of Gynecologic Oncology  Department of Obstetrics and Gynecology  [facility]  ___________________________________________  Chief Complaint: Chief Complaint  Patient presents with   Adnexal mass    History of Present Illness:  Joyce Griffin is a 70 y.o. y.o. female who is seen in consultation at the request of Dr. Landry Mellow for an evaluation of bilateral adnexal masses.  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  Her cirrhosis has been complicated by ascites as well as nonbleeding esophageal varices.  He has not used alcohol since 04/2019.  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  On recent ultrasound in early May, a new small mass was seen in the left kidney with further imaging work-up recommended.  Thus, she underwent CT of the abdomen and pelvis at the end of May  with findings of bilateral multicystic adnexal masses.  No definitive evidence of metastatic disease was noted.  The patient reports being in her general state of health.  She denies any abdominal or pelvic pain.  She has occasional twinges that she attributes to her  prior TRAM flap, done at the time of her breast cancer diagnosis and treatment.  She has some sensitivity around her prior incision for this.  She endorses no change recently in her appetite although notes that her appetite is never good.  More recently, she does not feel like eating a lot and she attributes this in some part to dental work that she has had done.  She works on increasing her protein intake given the fact that she does not eat meat.  She drinks protein shakes and eats protein bars.  She has very occasional nausea, denies any emesis.   She reports regular bowel function.  She has known internal hemorrhoids several months although has not had any bleeding from these in the last few months.  She has increased urinary frequency but attributes this to drinking a lot of water and being on a diuretic.  Her past medical history is notable for left breast cancer treated in 2004 with a left total mastectomy and TRAM reconstruction.  She has not had genetic testing.  She has well-controlled hypertension which improved significantly after she quit drinking.  She has occasional shortness of breath with exertion although this has improved significantly.  She denies any chest pain.  Her GYN history is notable for hysteroscopy with D&C, partial resection of submucosal fibroids in 04/2011.  Her family history is significant for ovarian cancer in her maternal grandmother as well as in a maternal first cousin.  The patient lives in [place] with a roommate.  She is not currently working.  PAST MEDICAL HISTORY:  Past Medical History:  Diagnosis Date   Anxiety    Breast cancer ([place]) 2004   left breast   Cirrhosis ([place])    Esophageal varices ([place])    Hypercholesteremia    Hypertension    NASH (nonalcoholic steatohepatitis)    Thyroid disease      PAST SURGICAL HISTORY:  Past Surgical History:  Procedure Laterality Date   ABDOMINOPLASTY  2004   APPENDECTOMY     AUGMENTATION MAMMAPLASTY Left 2004    following mastectomy   BREAST LUMPECTOMY  2004 X 2   CATARACT EXTRACTION     right eye 02/04/21, left eye 02-2021   COLONOSCOPY     2005,2019,   ESOPHAGOGASTRODUODENOSCOPY (EGD) WITH ESOPHAGEAL DILATION     2020and 2021   IR PARACENTESIS  04/19/2019   MASTECTOMY, PARTIAL Left 2012    OB/GYN HISTORY:  OB History  Gravida Para Term Preterm AB Living  1 1 1     1   SAB IAB Ectopic Multiple Live Births               # Outcome Date GA Lbr Len/2nd Weight Sex Delivery Anes PTL Lv  1 Term 47    M Vag-Spont       Patient's last menstrual period was 07/14/2003.  Age at menarche: Does not remember Age at menopause: Approximately 70 years old Hx of HRT: Denies, was on tamoxifen in the setting of her prior breast cancer history Hx of STDs: Denies Last pap: Per patient, 2019.  Within our system, patient had one in 2015 History of abnormal pap smears: Denies  SCREENING STUDIES:  Last mammogram: 2017  Last colonoscopy: 2019  MEDICATIONS: Outpatient Encounter Medications as of 05/22/2021  Medication Sig   carvedilol (COREG) 6.25 MG tablet Take 6.25 mg by mouth daily.   ferrous sulfate 325 (65 FE) MG tablet Take 325 mg by mouth daily.   furosemide (LASIX) 40 MG tablet Take 20 mg by mouth daily.   levothyroxine (SYNTHROID) 50 MCG tablet Take 50 mcg by mouth every morning.   spironolactone (ALDACTONE) 100 MG tablet Take 50 mg by mouth daily.   [DISCONTINUED] fluticasone (FLONASE) 50 MCG/ACT nasal spray Place 1 spray into both nostrils as needed.   [DISCONTINUED] hydrocortisone (ANUSOL-HC) 25 MG suppository Place 1 suppository (25 mg total) rectally 2 (two) times daily.   [DISCONTINUED] metoprolol tartrate (LOPRESSOR) 25 MG tablet Take 1 tablet by mouth daily.   No facility-administered encounter medications on file as of 05/22/2021.    ALLERGIES:  Allergies  Allergen Reactions   Lexapro [Escitalopram] Other (See Comments)    Dazed /Lupy   Bacitracin-Neomycin-Polymyxin Rash    Benzalkonium Chloride Rash   Codeine Other (See Comments)    Unknown    Nickel Rash     FAMILY HISTORY:  Family History  Problem Relation Age of Onset   COPD Father    Ovarian cancer Maternal Grandmother    Ovarian cancer Cousin    Colon cancer Neg Hx    Breast cancer Neg Hx    Pancreatic cancer Neg Hx    Prostate cancer Neg Hx      SOCIAL HISTORY:  Social Connections: Not on file    REVIEW OF SYSTEMS:  Denies appetite changes, fevers, chills, fatigue, unexplained weight changes. Denies hearing loss, neck lumps or masses, mouth sores, ringing in ears or voice changes. Denies cough or wheezing.  Denies shortness of breath. Denies chest pain or palpitations. Denies leg swelling. Denies abdominal distention, pain, blood in stools, constipation, diarrhea, nausea, vomiting, or early satiety. Denies pain with intercourse, dysuria, frequency, hematuria or incontinence. Denies hot flashes, pelvic pain, vaginal bleeding or vaginal discharge.   Denies joint pain, back pain or muscle pain/cramps. Denies itching, rash, or wounds. Denies dizziness, headaches, numbness or seizures. Denies swollen lymph nodes or glands, denies easy bruising or bleeding. Denies anxiety, depression, confusion, or decreased concentration.  Physical Exam:  Vital Signs for this encounter:  Blood pressure 125/73, pulse 70, temperature (!) 97.4 F (36.3 C), temperature source Tympanic, resp. rate 18, height 5' 6"  (1.676 m), weight 121 lb 6.4 oz (55.1 kg), last menstrual period 07/14/2003, SpO2 100 %. Body mass index is 19.59 kg/m. General: Alert, oriented, no acute distress.  HEENT: Normocephalic, atraumatic. Sclera anicteric.  Chest: Clear to auscultation bilaterally. No wheezes, rhonchi, or rales. Cardiovascular: Regular rate and rhythm, no murmurs, rubs, or gallops.  Abdomen: Normoactive bowel sounds. Soft, nondistended, nontender to palpation. No masses or hepatosplenomegaly appreciated.  Mild fullness  appreciated in the pelvis.  No palpable fluid wave.  Extremities: Grossly normal range of motion. Warm, well perfused. No edema bilaterally.  Skin: No rashes or lesions.  Lymphatics: No cervical, supraclavicular, or inguinal adenopathy.  GU:  Normal external female genitalia. No lesions. No discharge or bleeding.             Bladder/urethra:  No lesions or masses, well supported bladder             Vagina: Moderately atrophic, no lesions or masses.             Cervix: Normal appearing, no lesions.  Atrophic.  Uterus/adnexa: Small, mobile, no parametrial involvement or nodularity.  Uterus moves in conjunction with masses, 1 appreciated within the cul-de-sac and smooth, no nodularity.  With abdominal hand, these can be appreciated within several centimeters of the umbilicus.  Rectal: No rectal involvement, no nodularity appreciated.  LABORATORY AND RADIOLOGIC DATA:  Outside medical records were reviewed to synthesize the above history, along with the history and physical obtained during the visit.   Lab Results  Component Value Date   WBC 6.3 02/24/2021   HGB 13.1 02/24/2021   HCT 40.2 02/24/2021   PLT 161 02/24/2021   GLUCOSE 114 (H) 02/24/2021   CHOL 251 (H) 04/18/2014   TRIG 158 (H) 04/18/2014   HDL 51 04/18/2014   LDLCALC 168 (H) 04/18/2014   ALT 21 02/24/2021   AST 37 02/24/2021   NA 135 02/24/2021   K 4.2 02/24/2021   CL 105 02/24/2021   CREATININE 0.91 02/24/2021   BUN 21 02/24/2021   CO2 22 02/24/2021   TSH 2.063 04/18/2014   INR 1.3 (H) 02/24/2021   Albumin: 3.9  CA-125: 18.2 on 6/6  Pelvic ultrasound exam at [facility] on 05/19/2021: Uterus measures 5.7 x 2.5 x 4 cm with an endometrial lining of 1.6 cm. Neither adnexa is seen.  Large complex cystic multiseptated mass in the pelvis that extends from the right to the left adnexa.  Right adnexal portion measures 8.9 x 7.9 x 4.4 cm and left 9 x 7 x 5.5 cm.  CT A/P 05/06/2021: IMPRESSION: 1. There are new  multicystic masses arising from the bilateral ovaries, overall conglomerate of this lesion measuring 9.8 x 9.1 cm. The left ovarian component measures approximately 6.5 x 6.3 cm and the right ovarian component measures approximately 7.4 x 3.5 cm. Findings are highly concerning for ovarian malignancy. 2. No evidence of metastatic disease in the abdomen or pelvis. 3. There is a hypodense lesion of the lateral midportion of the left kidney measuring approximately 4-5 mm, which is low-attenuation relative to the renal cortex and medulla, although too small to reliably measure fat attenuation. This corresponds to finding of prior ultrasound and is consistent with a small, definitively benign angiomyolipoma. No further follow-up or characterization is required. 4. Stigmata of cirrhosis and portal hypertension, including gastroesophageal varices. Trace perihepatic and perisplenic ascites. 5. Cholelithiasis with mild gallbladder wall thickening, nonspecific in the setting of ascites.  CT A/P 03/2020: IMPRESSION: Evidence of cirrhosis with moderate ascites. Mild periesophageal varices.   Mild wall thickening of the ascending colon which may be secondary to patient's liver disease although could be seen with mild acute colitis of infectious or inflammatory nature.   Gallbladder wall thickening likely related to patient's ascites.   Aortic Atherosclerosis (ICD10-I70.0). Atherosclerotic coronary artery disease.   Several possible small uterine fibroids.

## 2021-05-25 ENCOUNTER — Telehealth: Payer: Self-pay

## 2021-05-25 ENCOUNTER — Telehealth: Payer: Self-pay | Admitting: Gynecologic Oncology

## 2021-05-25 NOTE — Telephone Encounter (Signed)
Told Joyce Griffin that she is scheduled for surgery on 06-08-21 with Dr. Berline Lopes.  A pre-op appointment will be scheduled with Melissa Cross,NP on the same day as her pre-surgical testing at [facility]. Will call her with that appointment once the pre-surgical appointment is set up. Pt verbalized understanding.

## 2021-05-25 NOTE — Telephone Encounter (Signed)
Called the patient again to discuss plan for surgery. No answer. Left VM with callback requested.  Jeral Pinch MD Gynecologic Oncology

## 2021-05-29 NOTE — Telephone Encounter (Signed)
Told Joyce Griffin that she is scheduled for a pre-op appointment on 06-04-21 at 40 with [name], NP. Her hospital presurgical testing appointment is on 06-05-21 at 0800.  Pt verbalized understanding.

## 2021-06-01 NOTE — Progress Notes (Signed)
Patient here for a pre-operative appointment prior to her scheduled surgery on June 08, 2021. She is scheduled for a robotic assisted laparoscopic bilateral salpingo-oophorectomy (removal of the ovaries and fallopian tubes), excision of pelvic masses, possible robotic assisted laparoscopic total hysterectomy (removal of the uterus and cervix), possible laparotomy (larger incision on your abdomen if needed), possible staging.  The surgery was discussed in detail.  See after visit summary for additional details.    Discussed post-op pain management in detail including the aspects of the enhanced recovery pathway.  Advised her that a new prescription would be sent in for tramadol and it is only to be used for after her upcoming surgery.  Also prescribed sennakot to be used after surgery and to hold if having loose stools.  Discussed bowel regimen in detail.     Discussed the use of SCDs, and measures to take at home to prevent DVT including frequent mobility.  Reportable signs and symptoms of DVT discussed. Post-operative instructions discussed and expectations for after surgery. Incisional care discussed as well including reportable signs and symptoms including erythema, drainage, wound separation.     10 minutes spent with the patient.  Verbalizing understanding of material discussed. No needs or concerns voiced at the end of the visit.   Advised patient to call for any needs.  Advised that her post-operative medications had been prescribed and could be picked up at any time.

## 2021-06-01 NOTE — Patient Instructions (Addendum)
Preparing for your Surgery  Plan for surgery on June 08, 2021 with Dr. Jeral Pinch at [place] will be scheduled for a robotic assisted laparoscopic bilateral salpingo-oophorectomy (removal of the ovaries and fallopian tubes), excision of pelvic masses, possible robotic assisted laparoscopic total hysterectomy (removal of the uterus and cervix), possible laparotomy (larger incision on your abdomen if needed), possible staging.   Pre-operative Testing -You will receive a phone call from presurgical testing at [facility] to arrange for a pre-operative appointment and lab work.  -Bring your insurance card, copy of an advanced directive if applicable, medication list  -At that visit, you will be asked to sign a consent for a possible blood transfusion in case a transfusion becomes necessary during surgery. The need for a blood transfusion is rare but having consent is a necessary part of your care.     -You should not be taking blood thinners or aspirin at least ten days prior to surgery unless instructed by your surgeon.  -Do not take supplements such as fish oil (omega 3), red yeast rice, turmeric before your surgery. You want to avoid medications with aspirin in them including headache powders such as BC or Goody's), Excedrin migraine.  Day Before Surgery at [place] will be asked to take in a light diet the day before surgery. You will be advised you can have clear liquids up until 3 hours before your surgery.    Eat a light diet the day before surgery.  Examples including soups, broths, toast, yogurt, mashed potatoes.  AVOID GAS PRODUCING FOODS. Things to avoid include carbonated beverages (fizzy beverages, sodas), raw fruits and raw vegetables (uncooked), or beans.   If your bowels are filled with gas, your surgeon will have difficulty visualizing your pelvic organs which increases your surgical risks.  Your role in recovery Your role is to become active as  soon as directed by your doctor, while still giving yourself time to heal.  Rest when you feel tired. You will be asked to do the following in order to speed your recovery:  - Cough and breathe deeply. This helps to clear and expand your lungs and can prevent pneumonia after surgery.  - [place]. Do mild physical activity. Walking or moving your legs help your circulation and body functions return to normal. Do not try to get up or walk alone the first time after surgery.   -If you develop swelling on one leg or the other, pain in the back of your leg, redness/warmth in one of your legs, please call the office or go to the Emergency Room to have a doppler to rule out a blood clot. For shortness of breath, chest pain-seek care in the Emergency Room as soon as possible. - Actively manage your pain. Managing your pain lets you move in comfort. We will ask you to rate your pain on a scale of zero to 10. It is your responsibility to tell your doctor or nurse where and how much you hurt so your pain can be treated.  Special Considerations -If you are diabetic, you may be placed on insulin after surgery to have closer control over your blood sugars to promote healing and recovery.  This does not mean that you will be discharged on insulin.  If applicable, your oral antidiabetics will be resumed when you are tolerating a solid diet.  -Your final pathology results from surgery should be available around one week after surgery and the  results will be relayed to you when available.  -FMLA forms can be faxed to [phone] and please allow 5-7 business days for completion.  Pain Management After Surgery -You have been prescribed your pain medication and bowel regimen medications before surgery so that you can have these available when you are discharged from the hospital. The pain medication is for use ONLY AFTER surgery and a new prescription will not be given.   -Make sure that you have  Ibuprofen at home to use on a regular basis after surgery for pain control.   -Review the attached handout on narcotic use and their risks and side effects.   Bowel Regimen -You have been prescribed Sennakot-S to take nightly to prevent constipation especially if you are taking the narcotic pain medication intermittently.  It is important to prevent constipation and drink adequate amounts of liquids. You can stop taking this medication when you are not taking pain medication and you are back on your normal bowel routine.  Risks of Surgery Risks of surgery are low but include bleeding, infection, damage to surrounding structures, re-operation, blood clots, and very rarely death.   Blood Transfusion Information (For the consent to be signed before surgery)  We will be checking your blood type before surgery so in case of emergencies, we will know what type of blood you would need.                                            WHAT IS A BLOOD TRANSFUSION?  A transfusion is the replacement of blood or some of its parts. Blood is made up of multiple cells which provide different functions. Red blood cells carry oxygen and are used for blood loss replacement. White blood cells fight against infection. Platelets control bleeding. Plasma helps clot blood. Other blood products are available for specialized needs, such as hemophilia or other clotting disorders. BEFORE THE TRANSFUSION  Who gives blood for transfusions?  You may be able to donate blood to be used at a later date on yourself (autologous donation). Relatives can be asked to donate blood. This is generally not any safer than if you have received blood from a stranger. The same precautions are taken to ensure safety when a relative's blood is donated. Healthy volunteers who are fully evaluated to make sure their blood is safe. This is blood bank blood. Transfusion therapy is the safest it has ever been in the practice of medicine. Before  blood is taken from a donor, a complete history is taken to make sure that person has no history of diseases nor engages in risky social behavior (examples are intravenous drug use or sexual activity with multiple partners). The donor's travel history is screened to minimize risk of transmitting infections, such as malaria. The donated blood is tested for signs of infectious diseases, such as HIV and hepatitis. The blood is then tested to be sure it is compatible with you in order to minimize the chance of a transfusion reaction. If you or a relative donates blood, this is often done in anticipation of surgery and is not appropriate for emergency situations. It takes many days to process the donated blood. RISKS AND COMPLICATIONS Although transfusion therapy is very safe and saves many lives, the main dangers of transfusion include:  Getting an infectious disease. Developing a transfusion reaction. This is an allergic reaction to something in  the blood you were given. Every precaution is taken to prevent this. The decision to have a blood transfusion has been considered carefully by your caregiver before blood is given. Blood is not given unless the benefits outweigh the risks.  AFTER SURGERY INSTRUCTIONS  Return to work: 4 weeks if applicable  Activity: 1. Be up and out of the bed during the day.  Take a nap if needed.  You may walk up steps but be careful and use the hand rail.  Stair climbing will tire you more than you think, you may need to stop part way and rest.   2. No lifting or straining for 6 weeks over 10 pounds. No pushing, pulling, straining for 6 weeks.  3. No driving for 1 week minimum if you were cleared to drive before surgery.  Do not drive if you are taking narcotic pain medicine and make sure that your reaction time has returned.   4. You can shower as soon as the next day after surgery. Shower daily.  Use your regular soap and water (not directly on the incision) and pat your  incision(s) dry afterwards; don't rub.  No tub baths or submerging your body in water until cleared by your surgeon. If you have the soap that was given to you by pre-surgical testing that was used before surgery, you do not need to use it afterwards because this can irritate your incisions.   5. No sexual activity and nothing in the vagina for 4 weeks (8 weeks if you have a hysterectomy).  6. You may experience a small amount of clear drainage from your incisions, which is normal.  If the drainage persists, increases, or changes color please call the office.  7. Do not use creams, lotions, or ointments such as neosporin on your incisions after surgery until advised by your surgeon because they can cause removal of the dermabond glue on your incisions.    8. You may experience vaginal spotting after surgery or around the 6-8 week mark from surgery when the stitches at the top of the vagina begin to dissolve (IF YOU HAVE A HYSTERECTOMY).  The spotting is normal but if you experience heavy bleeding, call our office.  9. Take ibuprofen first for pain and only use narcotic pain medication for severe pain not relieved by the Tylenol or Ibuprofen.    Diet: 1. Low sodium Heart Healthy Diet is recommended but you are cleared to resume your normal (before surgery) diet after your procedure.  2. It is safe to use a laxative, such as Miralax or Colace, if you have difficulty moving your bowels. You have been prescribed Sennakot at bedtime every evening to keep bowel movements regular and to prevent constipation.    Wound Care: 1. Keep clean and dry.  Shower daily.  Reasons to call the Doctor: Fever - Oral temperature greater than 100.4 degrees Fahrenheit Foul-smelling vaginal discharge Difficulty urinating Nausea and vomiting Increased pain at the site of the incision that is unrelieved with pain medicine. Difficulty breathing with or without chest pain New calf pain especially if only on one  side Sudden, continuing increased vaginal bleeding with or without clots.   Contacts: For questions or concerns you should contact:  Dr. Jeral Pinch at [phone]  [name], NP at [phone]  After Hours: call [phone] and have the GYN Oncologist paged/contacted (after 5 pm or on the weekends).  Messages sent via mychart are for non-urgent matters and are not responded to after hours so  for urgent needs, please call the after hours number.

## 2021-06-03 ENCOUNTER — Encounter: Payer: Medicare Other | Admitting: Gynecologic Oncology

## 2021-06-03 ENCOUNTER — Encounter (HOSPITAL_COMMUNITY): Payer: Medicare Other

## 2021-06-03 NOTE — Patient Instructions (Addendum)
DUE TO COVID-19 ONLY ONE VISITOR IS ALLOWED TO COME WITH YOU AND STAY IN THE WAITING ROOM ONLY DURING PRE OP AND PROCEDURE DAY OF SURGERY.   TWO VISITOR  MAY VISIT WITH YOU AFTER SURGERY IN YOUR PRIVATE ROOM DURING VISITING HOURS ONLY!  YOU NEED TO HAVE A COVID 19 TEST On___not needed ____ @_______ , THIS TEST MUST BE DONE BEFORE SURGERY,  COVID TESTING SITE [address] [place] [address], IT IS ON THE RIGHT GOING OUT [address] APPROXIMATELY  2 MINUTES PAST [employer] ON THE RIGHT. ONCE YOUR COVID TEST IS COMPLETED,  PLEASE BEGIN THE QUARANTINE INSTRUCTIONS AS OUTLINED IN YOUR HANDOUT.                Joyce Griffin  06/03/2021   Your procedure is scheduled on: 06-08-21   Report to [facility] Main  Entrance   Report to admitting at        1000 AM     Call this number if you have problems the morning of surgery [phone]    Remember: Do not eat food :After Midnight.   You may have clear liquids     until 0900 am     then nothing by mouth      CLEAR LIQUID DIET   Foods Allowed                                                                                 Foods Excluded water Black Coffee and tea, regular and decaf                             liquids that you cannot  Plain Jell-O any favor except red or purple                                           see through such as: Fruit ices (not with fruit pulp)                                                   milk, soups, orange juice  Iced Popsicles                                                       All solid food Carbonated beverages, regular and diet                                    Cranberry, grape and apple juices Sports drinks like Gatorade Lightly seasoned clear broth or consume(fat free) Sugar, honey syrup  _____________________________________________________________________     BRUSH YOUR TEETH MORNING OF SURGERY AND RINSE YOUR MOUTH OUT,  NO CHEWING GUM CANDY OR MINTS.     Take  these medicines the morning of surgery with A SIP OF WATER: carvedilol,levothyroxine                                 You may not have any metal on your body including hair pins and              piercings  Do not wear jewelry, make-up, lotions, powders or perfumes, deodorant             Do not wear nail polish on your fingernails.  Do not shave  48 hours prior to surgery.         Do not bring valuables to the hospital. [place].  Contacts, dentures or bridgework may not be worn into surgery.       Patients discharged the day of surgery will not be allowed to drive home. IF YOU ARE HAVING SURGERY AND GOING HOME THE SAME DAY, YOU MUST HAVE AN ADULT TO DRIVE YOU HOME AND BE WITH YOU FOR 24 HOURS. YOU MAY GO HOME BY TAXI OR UBER OR ORTHERWISE, BUT AN ADULT MUST ACCOMPANY YOU HOME AND STAY WITH YOU FOR 24 HOURS.  Name and phone number of your driver:  Special Instructions: N/A              Please read over the following fact sheets you were given: _____________________________________________________________________             [facility] - Preparing for Surgery Before surgery, you can play an important role.  Because skin is not sterile, your skin needs to be as free of germs as possible.  You can reduce the number of germs on your skin by washing with CHG (chlorahexidine gluconate) soap before surgery.  CHG is an antiseptic cleaner which kills germs and bonds with the skin to continue killing germs even after washing. Please DO NOT use if you have an allergy to CHG or antibacterial soaps.  If your skin becomes reddened/irritated stop using the CHG and inform your nurse when you arrive at Short Stay. Do not shave (including legs and underarms) for at least 48 hours prior to the first CHG shower.  You may shave your face/neck. Please follow these instructions carefully:  1.  Shower with CHG Soap the night before surgery and the  morning of  Surgery.  2.  If you choose to wash your hair, wash your hair first as usual with your  normal  shampoo.  3.  After you shampoo, rinse your hair and body thoroughly to remove the  shampoo.                           4.  Use CHG as you would any other liquid soap.  You can apply chg directly  to the skin and wash                       Gently with a scrungie or clean washcloth.  5.  Apply the CHG Soap to your body ONLY FROM THE NECK DOWN.   Do not use on face/ open  Wound or open sores. Avoid contact with eyes, ears mouth and genitals (private parts).                       Wash face,  Genitals (private parts) with your normal soap.             6.  Wash thoroughly, paying special attention to the area where your surgery  will be performed.  7.  Thoroughly rinse your body with warm water from the neck down.  8.  DO NOT shower/wash with your normal soap after using and rinsing off  the CHG Soap.                9.  Pat yourself dry with a clean towel.            10.  Wear clean pajamas.            11.  Place clean sheets on your bed the night of your first shower and do not  sleep with pets. Day of Surgery : Do not apply any lotions/deodorants the morning of surgery.  Please wear clean clothes to the hospital/surgery center.  FAILURE TO FOLLOW THESE INSTRUCTIONS MAY RESULT IN THE CANCELLATION OF YOUR SURGERY PATIENT SIGNATURE_________________________________  NURSE SIGNATURE__________________________________  WHAT IS A BLOOD TRANSFUSION? Blood Transfusion Information  A transfusion is the replacement of blood or some of its parts. Blood is made up of multiple cells which provide different functions. Red blood cells carry oxygen and are used for blood loss replacement. White blood cells fight against infection. Platelets control bleeding. Plasma helps clot blood. Other blood products are available for specialized needs, such as hemophilia or other clotting  disorders. BEFORE THE TRANSFUSION  Who gives blood for transfusions?  Healthy volunteers who are fully evaluated to make sure their blood is safe. This is blood bank blood. Transfusion therapy is the safest it has ever been in the practice of medicine. Before blood is taken from a donor, a complete history is taken to make sure that person has no history of diseases nor engages in risky social behavior (examples are intravenous drug use or sexual activity with multiple partners). The donor's travel history is screened to minimize risk of transmitting infections, such as malaria. The donated blood is tested for signs of infectious diseases, such as HIV and hepatitis. The blood is then tested to be sure it is compatible with you in order to minimize the chance of a transfusion reaction. If you or a relative donates blood, this is often done in anticipation of surgery and is not appropriate for emergency situations. It takes many days to process the donated blood. RISKS AND COMPLICATIONS Although transfusion therapy is very safe and saves many lives, the main dangers of transfusion include:  Getting an infectious disease. Developing a transfusion reaction. This is an allergic reaction to something in the blood you were given. Every precaution is taken to prevent this. The decision to have a blood transfusion has been considered carefully by your caregiver before blood is given. Blood is not given unless the benefits outweigh the risks. AFTER THE TRANSFUSION Right after receiving a blood transfusion, you will usually feel much better and more energetic. This is especially true if your red blood cells have gotten low (anemic). The transfusion raises the level of the red blood cells which carry oxygen, and this usually causes an energy increase. The nurse administering the transfusion will monitor you carefully for complications. HOME CARE  INSTRUCTIONS  No special instructions are needed after a transfusion.  You may find your energy is better. Speak with your caregiver about any limitations on activity for underlying diseases you may have. SEEK MEDICAL CARE IF:  Your condition is not improving after your transfusion. You develop redness or irritation at the intravenous (IV) site. SEEK IMMEDIATE MEDICAL CARE IF:  Any of the following symptoms occur over the next 12 hours: Shaking chills. You have a temperature by mouth above 102 F (38.9 C), not controlled by medicine. Chest, back, or muscle pain. People around you feel you are not acting correctly or are confused. Shortness of breath or difficulty breathing. Dizziness and fainting. You get a rash or develop hives. You have a decrease in urine output. Your urine turns a dark color or changes to pink, red, or brown. Any of the following symptoms occur over the next 10 days: You have a temperature by mouth above 102 F (38.9 C), not controlled by medicine. Shortness of breath. Weakness after normal activity. The white part of the eye turns yellow (jaundice). You have a decrease in the amount of urine or are urinating less often. Your urine turns a dark color or changes to pink, red, or brown. Document Released: 11/26/2000 Document Revised: 02/21/2012 Document Reviewed: 07/15/2008 [facility] Patient Information 2014 [employer], [place].  _______________________________________________________________________  ________________________________________________________________________

## 2021-06-03 NOTE — Progress Notes (Addendum)
PCP - [facility] Cardiologist - no  PPM/ICD -  Device Orders -  Rep Notified -   Chest x-ray -  EKG -  Stress Test -  ECHO - 2021  care everywhere Cardiac Cath -   Sleep Study -  CPAP -   Fasting Blood Sugar -  Checks Blood Sugar _____ times a day  Blood Thinner Instructions: Aspirin Instructions:  ERAS Protcol - PRE-SURGERY Ensure or G2-   COVID TEST- Ambulatory  Activity- Able to walk a flight of stairs without SOB and push mows grass without SOB Anesthesia review: HTN  Patient denies shortness of breath, fever, cough and chest pain at PAT appointment   All instructions explained to the patient, with a verbal understanding of the material. Patient agrees to go over the instructions while at home for a better understanding. Patient also instructed to self quarantine after being tested for COVID-19. The opportunity to ask questions was provided.

## 2021-06-04 ENCOUNTER — Inpatient Hospital Stay (HOSPITAL_BASED_OUTPATIENT_CLINIC_OR_DEPARTMENT_OTHER): Payer: Medicare Other | Admitting: Gynecologic Oncology

## 2021-06-04 ENCOUNTER — Other Ambulatory Visit: Payer: Self-pay

## 2021-06-04 VITALS — BP 131/72 | HR 61 | Temp 98.4°F | Resp 16 | Ht 66.0 in | Wt 123.0 lb

## 2021-06-04 DIAGNOSIS — N9489 Other specified conditions associated with female genital organs and menstrual cycle: Secondary | ICD-10-CM

## 2021-06-04 MED ORDER — TRAMADOL HCL 50 MG PO TABS: 50.0000 mg | ORAL_TABLET | Freq: Four times a day (QID) | ORAL | 0 refills | Status: AC | PRN

## 2021-06-04 MED ORDER — SENNOSIDES-DOCUSATE SODIUM 8.6-50 MG PO TABS: 2.0000 | ORAL_TABLET | Freq: Every day | ORAL | 0 refills | Status: AC

## 2021-06-05 ENCOUNTER — Encounter (HOSPITAL_COMMUNITY): Payer: Self-pay

## 2021-06-05 ENCOUNTER — Encounter (HOSPITAL_COMMUNITY)
Admission: RE | Admit: 2021-06-05 | Discharge: 2021-06-05 | Disposition: A | Discharge status: Home / Self Care | Payer: Medicare Other | Source: Ambulatory Visit | Attending: Gynecologic Oncology | Admitting: Gynecologic Oncology

## 2021-06-05 ENCOUNTER — Other Ambulatory Visit: Payer: Self-pay

## 2021-06-05 ENCOUNTER — Telehealth: Payer: Self-pay

## 2021-06-05 DIAGNOSIS — Z01818 Encounter for other preprocedural examination: Secondary | ICD-10-CM | POA: Diagnosis not present

## 2021-06-05 DIAGNOSIS — D270 Benign neoplasm of right ovary: Secondary | ICD-10-CM | POA: Diagnosis not present

## 2021-06-05 DIAGNOSIS — K7031 Alcoholic cirrhosis of liver with ascites: Secondary | ICD-10-CM | POA: Diagnosis not present

## 2021-06-05 DIAGNOSIS — Z885 Allergy status to narcotic agent status: Secondary | ICD-10-CM | POA: Diagnosis not present

## 2021-06-05 DIAGNOSIS — D271 Benign neoplasm of left ovary: Secondary | ICD-10-CM | POA: Diagnosis not present

## 2021-06-05 DIAGNOSIS — N7011 Chronic salpingitis: Secondary | ICD-10-CM | POA: Diagnosis not present

## 2021-06-05 DIAGNOSIS — Z79899 Other long term (current) drug therapy: Secondary | ICD-10-CM | POA: Diagnosis not present

## 2021-06-05 DIAGNOSIS — I700 Atherosclerosis of aorta: Secondary | ICD-10-CM | POA: Diagnosis not present

## 2021-06-05 DIAGNOSIS — Z9012 Acquired absence of left breast and nipple: Secondary | ICD-10-CM | POA: Diagnosis not present

## 2021-06-05 DIAGNOSIS — R1909 Other intra-abdominal and pelvic swelling, mass and lump: Secondary | ICD-10-CM | POA: Diagnosis present

## 2021-06-05 DIAGNOSIS — Z888 Allergy status to other drugs, medicaments and biological substances status: Secondary | ICD-10-CM | POA: Diagnosis not present

## 2021-06-05 DIAGNOSIS — Z8041 Family history of malignant neoplasm of ovary: Secondary | ICD-10-CM | POA: Diagnosis not present

## 2021-06-05 DIAGNOSIS — Z853 Personal history of malignant neoplasm of breast: Secondary | ICD-10-CM | POA: Diagnosis not present

## 2021-06-05 DIAGNOSIS — Z79890 Hormone replacement therapy (postmenopausal): Secondary | ICD-10-CM | POA: Diagnosis not present

## 2021-06-05 HISTORY — DX: Hypothyroidism, unspecified: E03.9

## 2021-06-05 HISTORY — DX: Fatty (change of) liver, not elsewhere classified: K76.0

## 2021-06-05 LAB — COMPREHENSIVE METABOLIC PANEL
ALT: 20 U/L (ref 0–44)
AST: 32 U/L (ref 15–41)
Albumin: 4.3 g/dL (ref 3.5–5.0)
Alkaline Phosphatase: 141 U/L — ABNORMAL HIGH (ref 38–126)
Anion gap: 8 (ref 5–15)
BUN: 26 mg/dL — ABNORMAL HIGH (ref 8–23)
CO2: 22 mmol/L (ref 22–32)
Calcium: 9.7 mg/dL (ref 8.9–10.3)
Chloride: 106 mmol/L (ref 98–111)
Creatinine, Ser: 0.75 mg/dL (ref 0.44–1.00)
GFR, Estimated: >60 mL/min (ref >60)
Glucose, Bld: 104 mg/dL — ABNORMAL HIGH (ref 70–99)
Potassium: 4.6 mmol/L (ref 3.5–5.1)
Sodium: 136 mmol/L (ref 135–145)
Total Bilirubin: 1.3 mg/dL — ABNORMAL HIGH (ref 0.3–1.2)
Total Protein: 8.5 g/dL — ABNORMAL HIGH (ref 6.5–8.1)

## 2021-06-05 LAB — CBC
HCT: 41 % (ref 36.0–46.0)
Hemoglobin: 13 g/dL (ref 12.0–15.0)
MCH: 29.1 pg (ref 26.0–34.0)
MCHC: 31.7 g/dL (ref 30.0–36.0)
MCV: 91.9 fL (ref 80.0–100.0)
Platelets: 148 10*3/uL — ABNORMAL LOW (ref 150–400)
RBC: 4.46 MIL/uL (ref 3.87–5.11)
RDW: 14.6 % (ref 11.5–15.5)
WBC: 5.9 10*3/uL (ref 4.0–10.5)
nRBC: 0 % (ref 0.0–0.2)

## 2021-06-05 LAB — URINALYSIS, ROUTINE W REFLEX MICROSCOPIC
Bilirubin Urine: NEGATIVE
Glucose, UA: NEGATIVE mg/dL
Hgb urine dipstick: NEGATIVE
Ketones, ur: NEGATIVE mg/dL
Nitrite: NEGATIVE
Protein, ur: NEGATIVE mg/dL
Specific Gravity, Urine: 1.013 (ref 1.005–1.030)
pH: 5 (ref 5.0–8.0)

## 2021-06-05 LAB — PROTIME-INR
INR: 1.3 — ABNORMAL HIGH (ref 0.8–1.2)
Prothrombin Time: 16 seconds — ABNORMAL HIGH (ref 11.4–15.2)

## 2021-06-05 LAB — APTT: aPTT: 35 seconds (ref 24–36)

## 2021-06-05 NOTE — Telephone Encounter (Signed)
Joyce Griffin stated that understands her written pre-op instructions dated 06-03-21. No questions or concerns at this time.

## 2021-06-07 LAB — URINE CULTURE: Culture: 30000 — AB

## 2021-06-08 ENCOUNTER — Ambulatory Visit (HOSPITAL_COMMUNITY)
Admission: RE | Admit: 2021-06-08 | Discharge: 2021-06-08 | Disposition: A | Discharge status: Home / Self Care | Payer: Medicare Other | Attending: Gynecologic Oncology | Admitting: Gynecologic Oncology

## 2021-06-08 ENCOUNTER — Encounter (HOSPITAL_COMMUNITY): Payer: Self-pay | Admitting: Gynecologic Oncology

## 2021-06-08 ENCOUNTER — Ambulatory Visit (HOSPITAL_COMMUNITY): Payer: Medicare Other | Admitting: Certified Registered Nurse Anesthetist

## 2021-06-08 ENCOUNTER — Encounter (HOSPITAL_COMMUNITY)
Admission: RE | Disposition: A | Discharge status: Home / Self Care | Payer: Self-pay | Source: Home / Self Care | Attending: Gynecologic Oncology

## 2021-06-08 DIAGNOSIS — Z8041 Family history of malignant neoplasm of ovary: Secondary | ICD-10-CM | POA: Diagnosis not present

## 2021-06-08 DIAGNOSIS — Z9012 Acquired absence of left breast and nipple: Secondary | ICD-10-CM | POA: Insufficient documentation

## 2021-06-08 DIAGNOSIS — N838 Other noninflammatory disorders of ovary, fallopian tube and broad ligament: Secondary | ICD-10-CM | POA: Diagnosis not present

## 2021-06-08 DIAGNOSIS — D271 Benign neoplasm of left ovary: Secondary | ICD-10-CM | POA: Insufficient documentation

## 2021-06-08 DIAGNOSIS — Z853 Personal history of malignant neoplasm of breast: Secondary | ICD-10-CM | POA: Insufficient documentation

## 2021-06-08 DIAGNOSIS — Z79890 Hormone replacement therapy (postmenopausal): Secondary | ICD-10-CM | POA: Diagnosis not present

## 2021-06-08 DIAGNOSIS — D270 Benign neoplasm of right ovary: Secondary | ICD-10-CM | POA: Diagnosis not present

## 2021-06-08 DIAGNOSIS — Z888 Allergy status to other drugs, medicaments and biological substances status: Secondary | ICD-10-CM | POA: Insufficient documentation

## 2021-06-08 DIAGNOSIS — N135 Crossing vessel and stricture of ureter without hydronephrosis: Secondary | ICD-10-CM | POA: Diagnosis not present

## 2021-06-08 DIAGNOSIS — R188 Other ascites: Secondary | ICD-10-CM | POA: Diagnosis not present

## 2021-06-08 DIAGNOSIS — E7800 Pure hypercholesterolemia, unspecified: Secondary | ICD-10-CM | POA: Diagnosis not present

## 2021-06-08 DIAGNOSIS — I10 Essential (primary) hypertension: Secondary | ICD-10-CM | POA: Diagnosis not present

## 2021-06-08 DIAGNOSIS — I700 Atherosclerosis of aorta: Secondary | ICD-10-CM | POA: Insufficient documentation

## 2021-06-08 DIAGNOSIS — N7011 Chronic salpingitis: Secondary | ICD-10-CM | POA: Diagnosis not present

## 2021-06-08 DIAGNOSIS — Z79899 Other long term (current) drug therapy: Secondary | ICD-10-CM | POA: Insufficient documentation

## 2021-06-08 DIAGNOSIS — N9489 Other specified conditions associated with female genital organs and menstrual cycle: Secondary | ICD-10-CM

## 2021-06-08 DIAGNOSIS — K7031 Alcoholic cirrhosis of liver with ascites: Secondary | ICD-10-CM | POA: Insufficient documentation

## 2021-06-08 DIAGNOSIS — Z885 Allergy status to narcotic agent status: Secondary | ICD-10-CM | POA: Diagnosis not present

## 2021-06-08 HISTORY — PX: ROBOTIC ASSISTED SALPINGO OOPHERECTOMY: SHX6082

## 2021-06-08 LAB — PREPARE RBC (CROSSMATCH)

## 2021-06-08 SURGERY — SALPINGO-OOPHORECTOMY, ROBOT-ASSISTED
Anesthesia: General

## 2021-06-08 MED ORDER — LACTATED RINGERS IR SOLN
Status: DC | PRN
  Administered 2021-06-08: 1000 mL

## 2021-06-08 MED ORDER — OXYCODONE HCL 5 MG PO TABS: 5.0000 mg | ORAL_TABLET | Freq: Once | ORAL | Status: DC | PRN

## 2021-06-08 MED ORDER — MEPERIDINE HCL 50 MG/ML IJ SOLN: 6.2500 mg | INTRAMUSCULAR | Status: DC | PRN

## 2021-06-08 MED ORDER — LIDOCAINE 2% (20 MG/ML) 5 ML SYRINGE
INTRAMUSCULAR | Status: AC
  Filled 2021-06-08: qty 5

## 2021-06-08 MED ORDER — CHLORHEXIDINE GLUCONATE 0.12 % MT SOLN
15.0000 mL | Freq: Once | OROMUCOSAL | Status: AC
  Administered 2021-06-08: 15 mL via OROMUCOSAL

## 2021-06-08 MED ORDER — SUGAMMADEX SODIUM 200 MG/2ML IV SOLN
INTRAVENOUS | Status: DC | PRN
  Administered 2021-06-08: 110 mg via INTRAVENOUS

## 2021-06-08 MED ORDER — NITROFURANTOIN MONOHYD MACRO 100 MG PO CAPS: 100.0000 mg | ORAL_CAPSULE | Freq: Two times a day (BID) | ORAL | 0 refills | Status: AC

## 2021-06-08 MED ORDER — ROCURONIUM BROMIDE 10 MG/ML (PF) SYRINGE
PREFILLED_SYRINGE | INTRAVENOUS | Status: DC | PRN
  Administered 2021-06-08 (×2): 10 mg via INTRAVENOUS
  Administered 2021-06-08: 40 mg via INTRAVENOUS
  Administered 2021-06-08: 10 mg via INTRAVENOUS

## 2021-06-08 MED ORDER — ORAL CARE MOUTH RINSE: 15.0000 mL | Freq: Once | OROMUCOSAL | Status: AC

## 2021-06-08 MED ORDER — ROCURONIUM BROMIDE 10 MG/ML (PF) SYRINGE
PREFILLED_SYRINGE | INTRAVENOUS | Status: AC
  Filled 2021-06-08: qty 10

## 2021-06-08 MED ORDER — EPHEDRINE SULFATE-NACL 50-0.9 MG/10ML-% IV SOSY
PREFILLED_SYRINGE | INTRAVENOUS | Status: DC | PRN
  Administered 2021-06-08: 5 mg via INTRAVENOUS
  Administered 2021-06-08: 10 mg via INTRAVENOUS
  Administered 2021-06-08: 5 mg via INTRAVENOUS
  Administered 2021-06-08: 10 mg via INTRAVENOUS

## 2021-06-08 MED ORDER — DEXAMETHASONE SODIUM PHOSPHATE 10 MG/ML IJ SOLN
INTRAMUSCULAR | Status: DC | PRN
  Administered 2021-06-08: 5 mg via INTRAVENOUS

## 2021-06-08 MED ORDER — BUPIVACAINE HCL 0.25 % IJ SOLN
INTRAMUSCULAR | Status: DC | PRN
  Administered 2021-06-08: 35 mL

## 2021-06-08 MED ORDER — FENTANYL CITRATE (PF) 100 MCG/2ML IJ SOLN
INTRAMUSCULAR | Status: AC
  Filled 2021-06-08: qty 2

## 2021-06-08 MED ORDER — ALBUMIN HUMAN 5 % IV SOLN: INTRAVENOUS | Status: DC | PRN

## 2021-06-08 MED ORDER — PROMETHAZINE HCL 25 MG/ML IJ SOLN: 6.2500 mg | INTRAMUSCULAR | Status: DC | PRN

## 2021-06-08 MED ORDER — HYDROMORPHONE HCL 1 MG/ML IJ SOLN: 0.2500 mg | INTRAMUSCULAR | Status: DC | PRN

## 2021-06-08 MED ORDER — PROPOFOL 10 MG/ML IV BOLUS
INTRAVENOUS | Status: DC | PRN
  Administered 2021-06-08: 100 mg via INTRAVENOUS
  Administered 2021-06-08: 30 mg via INTRAVENOUS

## 2021-06-08 MED ORDER — LIDOCAINE 2% (20 MG/ML) 5 ML SYRINGE
INTRAMUSCULAR | Status: DC | PRN
  Administered 2021-06-08: 40 mg via INTRAVENOUS
  Administered 2021-06-08: 60 mg via INTRAVENOUS

## 2021-06-08 MED ORDER — LACTATED RINGERS IV SOLN: INTRAVENOUS | Status: DC | PRN

## 2021-06-08 MED ORDER — LACTATED RINGERS IV SOLN: INTRAVENOUS | Status: DC

## 2021-06-08 MED ORDER — AMISULPRIDE (ANTIEMETIC) 5 MG/2ML IV SOLN: 10.0000 mg | Freq: Once | INTRAVENOUS | Status: DC | PRN

## 2021-06-08 MED ORDER — HEPARIN SODIUM (PORCINE) 5000 UNIT/ML IJ SOLN
5000.0000 [IU] | INTRAMUSCULAR | Status: AC
Start: 2021-06-08 — End: 2021-06-08
  Administered 2021-06-08: 5000 [IU] via SUBCUTANEOUS
  Filled 2021-06-08: qty 1

## 2021-06-08 MED ORDER — DEXAMETHASONE SODIUM PHOSPHATE 4 MG/ML IJ SOLN
4.0000 mg | INTRAMUSCULAR | Status: DC
Start: 2021-06-08 — End: 2021-06-08

## 2021-06-08 MED ORDER — MIDAZOLAM HCL 2 MG/2ML IJ SOLN
INTRAMUSCULAR | Status: AC
  Filled 2021-06-08: qty 2

## 2021-06-08 MED ORDER — FENTANYL CITRATE (PF) 100 MCG/2ML IJ SOLN
INTRAMUSCULAR | Status: DC | PRN
  Administered 2021-06-08: 50 ug via INTRAVENOUS
  Administered 2021-06-08 (×2): 25 ug via INTRAVENOUS

## 2021-06-08 MED ORDER — MIDAZOLAM HCL 5 MG/5ML IJ SOLN
INTRAMUSCULAR | Status: DC | PRN
  Administered 2021-06-08: 1 mg via INTRAVENOUS

## 2021-06-08 MED ORDER — OXYCODONE HCL 5 MG/5ML PO SOLN: 5.0000 mg | Freq: Once | ORAL | Status: DC | PRN

## 2021-06-08 MED ORDER — SCOPOLAMINE 1 MG/3DAYS TD PT72
1.0000 | MEDICATED_PATCH | TRANSDERMAL | Status: DC
  Administered 2021-06-08: 1.5 mg via TRANSDERMAL
  Filled 2021-06-08: qty 1

## 2021-06-08 MED ORDER — STERILE WATER FOR IRRIGATION IR SOLN
Status: DC | PRN
  Administered 2021-06-08: 1000 mL

## 2021-06-08 MED ORDER — PHENYLEPHRINE 40 MCG/ML (10ML) SYRINGE FOR IV PUSH (FOR BLOOD PRESSURE SUPPORT)
PREFILLED_SYRINGE | INTRAVENOUS | Status: AC
  Filled 2021-06-08: qty 20

## 2021-06-08 MED ORDER — PROPOFOL 10 MG/ML IV BOLUS
INTRAVENOUS | Status: AC
  Filled 2021-06-08: qty 20

## 2021-06-08 MED ORDER — BUPIVACAINE HCL 0.25 % IJ SOLN
INTRAMUSCULAR | Status: AC
  Filled 2021-06-08: qty 1

## 2021-06-08 MED ORDER — ONDANSETRON HCL 4 MG/2ML IJ SOLN
INTRAMUSCULAR | Status: DC | PRN
  Administered 2021-06-08: 4 mg via INTRAVENOUS

## 2021-06-08 SURGICAL SUPPLY — 69 items
ADH SKN CLS APL DERMABOND .7 (GAUZE/BANDAGES/DRESSINGS) ×1
AGENT HMST KT MTR STRL THRMB (HEMOSTASIS) ×1
APL ESCP 34 STRL LF DISP (HEMOSTASIS) ×1
APPLICATOR SURGIFLO ENDO (HEMOSTASIS) ×2 IMPLANT
BACTOSHIELD CHG 4% 4OZ (MISCELLANEOUS) ×2
BAG LAPAROSCOPIC 12 15 PORT 16 (BASKET) IMPLANT
BAG RETRIEVAL 12/15 (BASKET) ×4
BAG RETRIEVAL 12/15MM (BASKET) ×2
BAG SPEC RTRVL LRG 6X4 10 (ENDOMECHANICALS)
BLADE SURG SZ10 CARB STEEL (BLADE) IMPLANT
CATH SILICONE 14FRX5CC (CATHETERS) ×2 IMPLANT
COVER BACK TABLE 60X90IN (DRAPES) ×3 IMPLANT
COVER TIP SHEARS 8 DVNC (MISCELLANEOUS) ×1 IMPLANT
COVER TIP SHEARS 8MM DA VINCI (MISCELLANEOUS) ×3
COVER WAND RF STERILE (DRAPES) IMPLANT
DERMABOND ADVANCED (GAUZE/BANDAGES/DRESSINGS) ×2
DERMABOND ADVANCED .7 DNX12 (GAUZE/BANDAGES/DRESSINGS) ×1 IMPLANT
DRAPE ARM DVNC X/XI (DISPOSABLE) ×4 IMPLANT
DRAPE COLUMN DVNC XI (DISPOSABLE) ×1 IMPLANT
DRAPE DA VINCI XI ARM (DISPOSABLE) ×12
DRAPE DA VINCI XI COLUMN (DISPOSABLE) ×3
DRAPE SHEET LG 3/4 BI-LAMINATE (DRAPES) ×3 IMPLANT
DRAPE SURG IRRIG POUCH 19X23 (DRAPES) ×3 IMPLANT
DRSG OPSITE POSTOP 4X6 (GAUZE/BANDAGES/DRESSINGS) IMPLANT
DRSG OPSITE POSTOP 4X8 (GAUZE/BANDAGES/DRESSINGS) IMPLANT
ELECT PENCIL ROCKER SW 15FT (MISCELLANEOUS) ×2 IMPLANT
ELECT REM PT RETURN 15FT ADLT (MISCELLANEOUS) ×3 IMPLANT
GAUZE 4X4 16PLY RFD (DISPOSABLE) ×2 IMPLANT
GLOVE SURG POLYISO LF SZ6 (GLOVE) ×8 IMPLANT
GLOVE SURG POLYISO LF SZ6.5 (GLOVE) ×4 IMPLANT
GLOVE SURG UNDER POLY LF SZ6.5 (GLOVE) ×6 IMPLANT
GOWN STRL REUS W/ TWL LRG LVL3 (GOWN DISPOSABLE) ×4 IMPLANT
GOWN STRL REUS W/ TWL XL LVL3 (GOWN DISPOSABLE) IMPLANT
GOWN STRL REUS W/TWL LRG LVL3 (GOWN DISPOSABLE) ×15
GOWN STRL REUS W/TWL XL LVL3 (GOWN DISPOSABLE) ×3
HOLDER FOLEY CATH W/STRAP (MISCELLANEOUS) ×3 IMPLANT
IRRIG SUCT STRYKERFLOW 2 WTIP (MISCELLANEOUS) ×3
IRRIGATION SUCT STRKRFLW 2 WTP (MISCELLANEOUS) ×1 IMPLANT
KIT TURNOVER KIT A (KITS) ×3 IMPLANT
MANIPULATOR UTERINE 4.5 ZUMI (MISCELLANEOUS) ×3 IMPLANT
NDL HYPO 21X1.5 SAFETY (NEEDLE) ×1 IMPLANT
NEEDLE HYPO 21X1.5 SAFETY (NEEDLE) ×3 IMPLANT
OBTURATOR OPTICAL STANDARD 8MM (TROCAR) ×3
OBTURATOR OPTICAL STND 8 DVNC (TROCAR) ×1
OBTURATOR OPTICALSTD 8 DVNC (TROCAR) ×1 IMPLANT
PACK ROBOT GYN CUSTOM WL (TRAY / TRAY PROCEDURE) ×3 IMPLANT
PAD POSITIONING PINK XL (MISCELLANEOUS) ×3 IMPLANT
PORT ACCESS TROCAR AIRSEAL 12 (TROCAR) ×1 IMPLANT
PORT ACCESS TROCAR AIRSEAL 5M (TROCAR) ×2
POUCH SPECIMEN RETRIEVAL 10MM (ENDOMECHANICALS) IMPLANT
SCRUB CHG 4% DYNA-HEX 4OZ (MISCELLANEOUS) ×1 IMPLANT
SEAL CANN UNIV 5-8 DVNC XI (MISCELLANEOUS) ×4 IMPLANT
SEAL XI 5MM-8MM UNIVERSAL (MISCELLANEOUS) ×12
SET TRI-LUMEN FLTR TB AIRSEAL (TUBING) ×3 IMPLANT
SPONGE LAP 18X18 RF (DISPOSABLE) ×2 IMPLANT
SURGIFLO W/THROMBIN 8M KIT (HEMOSTASIS) ×2 IMPLANT
SUT MNCRL AB 4-0 PS2 18 (SUTURE) IMPLANT
SUT PDS AB 1 TP1 96 (SUTURE) IMPLANT
SUT VIC AB 0 CT1 27 (SUTURE)
SUT VIC AB 0 CT1 27XBRD ANTBC (SUTURE) IMPLANT
SUT VIC AB 2-0 CT1 27 (SUTURE)
SUT VIC AB 2-0 CT1 TAPERPNT 27 (SUTURE) IMPLANT
SUT VIC AB 4-0 PS2 18 (SUTURE) ×6 IMPLANT
TOWEL OR NON WOVEN STRL DISP B (DISPOSABLE) ×3 IMPLANT
TRAP SPECIMEN MUCUS 40CC (MISCELLANEOUS) IMPLANT
TROCAR XCEL NON-BLD 5MMX100MML (ENDOMECHANICALS) IMPLANT
UNDERPAD 30X36 HEAVY ABSORB (UNDERPADS AND DIAPERS) ×3 IMPLANT
WATER STERILE IRR 1000ML POUR (IV SOLUTION) ×3 IMPLANT
YANKAUER SUCT BULB TIP 10FT TU (MISCELLANEOUS) ×2 IMPLANT

## 2021-06-08 NOTE — Discharge Instructions (Addendum)
06/08/2021  Return to work: 4-6 weeks if applicable  Today Dr. Berline Lopes removed both ovaries and fallopian tubes. On frozen section, the pathologist did not feel these cysts were cancer. Dr. Berline Lopes will call you with the results of your final pathology when ready.  Use a peri bottle to rinse the vulva when you urinate since you have a small tear in that area that occurred during surgery.   On your pre-operative urine sample, it appears you have a mild bladder infection with the bacteria, E Coli. We have sent in a medication called macrobid that you will take twice a day for five days to treat this.  Activity: 1. Be up and out of the bed during the day.  Take a nap if needed.  You may walk up steps but be careful and use the hand rail.  Stair climbing will tire you more than you think, you may need to stop part way and rest.   2. No lifting or straining for 6 weeks. No pushing, pulling, straining for 6 weeks.  3. No driving for 1 week(s) if you were cleared to drive before surgery.  Do not drive if you are taking narcotic pain medicine and you need to make sure that your reaction time has returned.  4. Shower daily.  Use your regular soap and water and pat your incisions dry; don't rub.  No tub baths until cleared by your surgeon.   5. No sexual activity and nothing in the vagina for 4 weeks.  6. You may experience a small amount of clear drainage from your incisions, which is normal.  If the drainage persists or increases, please call the office.  7. You may experience vaginal spotting after surgery.  The spotting is normal but if you experience heavy bleeding, call our office.  8. Take ibuprofen first for pain and only use narcotic pain medication for severe pain not relieved by the Ibuprofen.    Diet: 1. Low sodium Heart Healthy Diet is recommended.  2. It is safe to use a laxative, such as Miralax or Colace, if you have difficulty moving your bowels. You can take Sennakot at bedtime  every evening to keep bowel movements regular and to prevent constipation.    Wound Care: 1. Keep clean and dry.  Shower daily.  Reasons to call the Doctor: Fever - Oral temperature greater than 100.4 degrees Fahrenheit Foul-smelling vaginal discharge Difficulty urinating Nausea and vomiting Increased pain at the site of the incision that is unrelieved with pain medicine. Difficulty breathing with or without chest pain New calf pain especially if only on one side Sudden, continuing increased vaginal bleeding with or without clots.   Contacts: For questions or concerns you should contact:  Dr. Jeral Pinch at [phone]  [name], NP at [phone]  After Hours: call [phone] and have the GYN Oncologist paged/contacted

## 2021-06-08 NOTE — Anesthesia Postprocedure Evaluation (Signed)
Anesthesia Post Note  Patient: Joyce Griffin  Procedure(s) Performed: XI ROBOTIC ASSISTED BILATERAL SALPINGO OOPHORECTOMY     Patient location during evaluation: PACU Anesthesia Type: General Level of consciousness: awake Pain management: pain level controlled Vital Signs Assessment: post-procedure vital signs reviewed and stable Respiratory status: spontaneous breathing, nonlabored ventilation, respiratory function stable and patient connected to nasal cannula oxygen Cardiovascular status: blood pressure returned to baseline and stable Postop Assessment: no apparent nausea or vomiting Anesthetic complications: no   No notable events documented.  Last Vitals:  Vitals:   06/08/21 1530 06/08/21 1545  BP: 140/73 (!) 146/74  Pulse: (!) 58 60  Resp: 13 13  Temp:  (!) 34.8 C  SpO2: 99% 98%    Last Pain:  Vitals:   06/08/21 1545  TempSrc:   PainSc: 2                  Karter Haire P Auther Lyerly

## 2021-06-08 NOTE — Progress Notes (Signed)
Called patient's contact, Carlyon Prows. No answer. Left voicemail with my callback number if he would like to talk about Varina's surgery.  Jeral Pinch MD Gynecologic Oncology

## 2021-06-08 NOTE — Interval H&P Note (Signed)
History and Physical Interval Note:  06/08/2021 9:16 AM  Joyce Griffin  has presented today for surgery, with the diagnosis of BILATERAL COMPLEX ADNEXAL  MASSIVE.  The various methods of treatment have been discussed with the patient and family. After consideration of risks, benefits and other options for treatment, the patient has consented to  Procedure(s): XI ROBOTIC ASSISTED BILATERAL SALPINGO OOPHORECTOMY (N/A) EXCISION PELVIC MASSES (N/A) POSSIBLE XI ROBOTIC ASSISTED TOTAL HYSTERECTOMY, POSSIBLE LAPAROTOMY AND POSSIBLE STAGING (N/A) as a surgical intervention.  The patient's history has been reviewed, patient examined, no change in status, stable for surgery.  I have reviewed the patient's chart and labs.  Questions were answered to the patient's satisfaction.     Lafonda Mosses

## 2021-06-08 NOTE — Anesthesia Preprocedure Evaluation (Addendum)
Anesthesia Evaluation  Patient identified by MRN, date of birth, ID band Patient awake    Reviewed: Allergy & Precautions, NPO status , Patient's Chart, lab work & pertinent test results, reviewed documented beta blocker date and time   Airway Mallampati: III  TM Distance: >3 FB Neck ROM: Full    Dental no notable dental hx. (+) Dental Advisory Given, Chipped,    Pulmonary former smoker,  Quit smoking 1993, 13 pack year history    Pulmonary exam normal breath sounds clear to auscultation       Cardiovascular hypertension, Pt. on medications and Pt. on home beta blockers Normal cardiovascular exam Rhythm:Regular Rate:Normal     Neuro/Psych PSYCHIATRIC DISORDERS Anxiety negative neurological ROS     GI/Hepatic negative GI ROS, (+) Cirrhosis   Esophageal Varices and ascites    , Hepatitis -Plt 148, INR 1.3   Endo/Other  Hypothyroidism   Renal/GU negative Renal ROS   Complex bilateral adnexal mass  CT A/P: 1. There are new multicystic masses arising from the bilateral ovaries, overall conglomerate of this lesion measuring 9.8 x 9.1 cm. The left ovarian component measures approximately 6.5 x 6.3 cm and the right ovarian component measures approximately 7.4 x 3.5 cm. Findings are highly concerning for ovarian malignancy. 2. No evidence of metastatic disease in the abdomen or pelvis. 3. There is a hypodense lesion of the lateral midportion of the left kidney measuring approximately 4-5 mm, which is low-attenuation relative to the renal cortex and medulla, although too small to reliably measure fat attenuation. This corresponds to finding of prior ultrasound and is consistent with a small, definitively benign angiomyolipoma. No further follow-up or characterization is required. 4. Stigmata of cirrhosis and portal hypertension, including gastroesophageal varices. Trace perihepatic and perisplenic ascites. 5. Cholelithiasis  with mild gallbladder wall thickening, nonspecific in the setting of ascites.    Musculoskeletal negative musculoskeletal ROS (+)   Abdominal   Peds  Hematology negative hematology ROS (+) hct 41, plt 148   Anesthesia Other Findings   Reproductive/Obstetrics negative OB ROS                            Anesthesia Physical Anesthesia Plan  ASA: 4  Anesthesia Plan: General   Post-op Pain Management:    Induction: Intravenous  PONV Risk Score and Plan: 4 or greater and Ondansetron, Dexamethasone, Midazolam, Scopolamine patch - Pre-op and Treatment may vary due to age or medical condition  Airway Management Planned: Oral ETT  Additional Equipment: None  Intra-op Plan:   Post-operative Plan: Extubation in OR  Informed Consent: I have reviewed the patients History and Physical, chart, labs and discussed the procedure including the risks, benefits and alternatives for the proposed anesthesia with the patient or authorized representative who has indicated his/her understanding and acceptance.     Dental advisory given  Plan Discussed with: CRNA  Anesthesia Plan Comments: (No OG/NGT Coagulopathy 2/2 cirrhosis- type and cross x 2 units )        Anesthesia Quick Evaluation

## 2021-06-08 NOTE — Transfer of Care (Signed)
Immediate Anesthesia Transfer of Care Note  Patient: Joyce Griffin  Procedure(s) Performed: XI ROBOTIC ASSISTED BILATERAL SALPINGO OOPHORECTOMY  Patient Location: PACU  Anesthesia Type:General  Level of Consciousness: drowsy  Airway & Oxygen Therapy: Patient Spontanous Breathing and Patient connected to face mask oxygen  Post-op Assessment: Report given to RN and Post -op Vital signs reviewed and stable  Post vital signs: Reviewed and stable  Last Vitals:  Vitals Value Taken Time  BP    Temp    Pulse 59 06/08/21 1443  Resp 17 06/08/21 1443  SpO2 100 % 06/08/21 1443  Vitals shown include unvalidated device data.  Last Pain:  Vitals:   06/08/21 0929  TempSrc:   PainSc: 0-No pain         Complications: No notable events documented.

## 2021-06-08 NOTE — Op Note (Addendum)
OPERATIVE NOTE  Pre-operative Diagnosis: Bilateral complex adnexal masses  Post-operative Diagnosis: same, retroperitoneal fibrosis; frozen section of both ovaries consistent with benign serous cyst  Operation: Robotic-assisted laparoscopic bilateral salpingo-oophorectomy, right ureterolysis   Surgeon: Jeral Pinch MD  Assistant Surgeon: [name] NP  Anesthesia: GET  Urine Output: 300cc  Operative Findings: On EUA, small mobile uterus with 10-12cm mobile mass filling the cul de sac, smooth. On intra-abdominal entry, very nodular and abnormal appearing liver. Normal upper abdominal survey otherwise. Some adhesions of the ascending colon of the right sidewall. Otherwise, small and large bowel normal appearing. Uterus 6cm with a 1cm anterior LUS pedunculated serosal fibroid. Bilateral ovaries with multi-cystic, smooth lesions (clear), the left measuring 6-8cm and the right 4-6cm. Bilateral retroperitoneum with fiborsis, especially on the right were right ovary densely adherent to the parametrial tissue and broad ligament. NO ascites. NO intra-abdominal or pelvic evidence of disease.   Estimated Blood Loss:  less than 100 mL      Total IV Fluids: see I&O flowsheet         Specimens: pelvic washings, bilateral tubes and ovaries, right pelvic side wall fibrosis          Complications:  None apparent; patient tolerated the procedure well.         Disposition: PACU - hemodynamically stable.  Procedure Details  The patient was seen in the Holding Room. The risks, benefits, complications, treatment options, and expected outcomes were discussed with the patient.  The patient concurred with the proposed plan, giving informed consent.  The site of surgery properly noted/marked. The patient was identified as Joyce Griffin and the procedure verified as a Robotic-assisted bilateral salpingo oophorectomy, possible staging.   After induction of anesthesia, the patient was draped and prepped in  the usual sterile manner. Patient was placed in supine position after anesthesia and draped and prepped in the usual sterile manner as follows: Her arms were tucked to her side with all appropriate precautions.  The patient was secured to the OR table using padding and tape.  The patient was placed in the semi-lithotomy position in [place].  The perineum and vagina were prepped with CholoraPrep. The patient was draped after the CholoraPrep had been allowed to dry for 3 minutes.  A Time Out was held and the above information confirmed.  The urethra was prepped with Betadine. Foley catheter was placed.  A sterile speculum was placed in the vagina.  The cervix was grasped with a single-tooth tenaculum. The cervix was dilated with Kennon Rounds dilators.  The ZUMI uterine manipulator with a small colpotomizer ring was placed without difficulty.  A pneum occluder balloon was placed over the manipulator.  OG tube placement was attempted but no contents noted in tube; aggressive reattempts were not felt safe given known varices.   Next, a 10 mm skin incision was made 1 cm below the subcostal margin in the midclavicular line.  The 5 mm Optiview port and scope was used for direct entry.  Opening pressure was under 10 mm CO2.  The abdomen was insufflated and the findings were noted as above.   At this point and all points during the procedure, the patient's intra-abdominal pressure did not exceed 15 mmHg. Next, an 8 mm skin incision was made superior to the umbilicus and a right and left port were placed about 8 cm lateral to the robot port on the right and left side.  A fourth arm was placed on the right.  The 5 mm assist  trocar was exchanged for a 10-12 mm port. All ports were placed under direct visualization.  The patient was placed in steep Trendelenburg.  Bowel was already noted to be in the upper abdomen.  The robot was docked in the normal manner.  The right and left peritoneum were opened parallel to the IP  ligament to open the retroperitoneal spaces bilaterally. The round ligaments were preserved. The ureter was noted to be on the medial leaf of the broad ligament.  The peritoneum above the ureter was incised and stretched and the fallopian tube and infundibulopelvic ligament were skeletonized, cauterized and cut just lateral to the uterine fundus. The left adnexa was detached first and placed in an Endocatch bag. The right ovary, given dense adhesions to the right parametrial tissue, required mobilization of the ureter with ureterolysis to inferior to the level of the attached ovary. The ovary was careful shelled out from surrounding fibrotic parametrial tissue with sharp dissection and short bursts of electrocautery. Once freed, the right adnex was also put in an Endocatch bag. Both bags were removed through the assist trocar. After removing all robotic instruments, the adenxal cysts were drained in a contained manner and the adnexa removed piecemeal from the bag in a contained manner. Both adnexa were sent for frozen section.    Pedicles were inspected and excellent hemostasis was achieved.  Given ureterolysis on the right and liver disease, Surgiflo was placed within the surgical bed along the right pelvic sidewall.   Once frozen section returned revealing benign ovarian cysts, the procedure was completed.  Robotic instruments were removed under direct visulaization.  The robot was undocked. The skin at the 10-16m port was opened to achieve hemostasis of the subcutaneous tissue. The fascia at the 10-12 mm port was closed with 0 Vicryl on a UR-5 needle in a running fashion.  The subcuticular tissue of all incisions was closed with 4-0 Vicryl and the skin was closed with 4-0 Monocryl in a subcuticular manner.  Dermabond was applied.    The vagina was swabbed with minimal bleeding noted after the manipulator and KOH ring were removed.  Small peri-urethral laceration noted, hemostatic.  All sponge, lap and  needle counts were correct x  3.   The patient was transferred to the recovery room in stable condition.  KJeral Pinch MD

## 2021-06-08 NOTE — Anesthesia Procedure Notes (Signed)
Procedure Name: Intubation Date/Time: 06/08/2021 11:31 AM Performed by: West Pugh, CRNA Pre-anesthesia Checklist: Patient identified, Emergency Drugs available, Suction available, Patient being monitored and Timeout performed Patient Re-evaluated:Patient Re-evaluated prior to induction Oxygen Delivery Method: Circle system utilized Preoxygenation: Pre-oxygenation with 100% oxygen Induction Type: IV induction Ventilation: Mask ventilation without difficulty Laryngoscope Size: Mac and 3 Grade View: Grade II Tube type: Oral Tube size: 7.0 mm Number of attempts: 1 Airway Equipment and Method: Stylet Placement Confirmation: ETT inserted through vocal cords under direct vision, positive ETCO2, CO2 detector and breath sounds checked- equal and bilateral Secured at: 21 cm Tube secured with: Tape Dental Injury: Teeth and Oropharynx as per pre-operative assessment

## 2021-06-09 ENCOUNTER — Encounter (HOSPITAL_COMMUNITY): Payer: Self-pay | Admitting: Gynecologic Oncology

## 2021-06-09 ENCOUNTER — Telehealth: Payer: Self-pay

## 2021-06-09 NOTE — Telephone Encounter (Signed)
Spoke with Joyce Griffin this morning. She states she is drinking and urinating well. No discomfort with urination, has not been using the peri-bottle. Reinforced how to use peri-bottle if she does have discomfort. She has not had a BM yet but is passing gas. She reports decreased appetite this morning, she's been drinking lots of water and having some jello. She is planning on having a protein shake soon. She denies fever or chills. She has not looked at her incisions yet but says they feel ok. Encouraged her to evaluate her incisions to make sure there is no redness or drainage. She said she will look at them when she takes a shower. She has been uncomfortable and taken one tramadol. She states she is having discomfort in her shoulders and neck. Per [name], NP this could be from the gas used during the surgery. This should get better over the next few days. Can alternate using heat and ice on abdominal incisions to ease discomfort.   Instructed to call office with any fever, chills, purulent drainage, uncontrolled pain or any other questions or concerns. Patient verbalizes understanding.   Pt aware of post op appointments as well as the office number [phone] and after hours number [phone] to call if she has any questions or concerns

## 2021-06-10 LAB — CYTOLOGY - NON PAP

## 2021-06-10 LAB — SURGICAL PATHOLOGY

## 2021-06-12 LAB — TYPE AND SCREEN
ABO/RH(D): O POS
Antibody Screen: NEGATIVE
Unit division: 0
Unit division: 0

## 2021-06-12 LAB — BPAM RBC
Blood Product Expiration Date: 202207302359
Blood Product Expiration Date: 202207302359
Unit Type and Rh: 5100
Unit Type and Rh: 5100

## 2021-06-19 DIAGNOSIS — K7031 Alcoholic cirrhosis of liver with ascites: Secondary | ICD-10-CM | POA: Diagnosis not present

## 2021-06-19 DIAGNOSIS — F1011 Alcohol abuse, in remission: Secondary | ICD-10-CM | POA: Diagnosis not present

## 2021-07-06 ENCOUNTER — Encounter: Payer: Self-pay | Admitting: Gynecologic Oncology

## 2021-07-06 ENCOUNTER — Inpatient Hospital Stay: Payer: Medicare Other | Attending: Gynecologic Oncology | Admitting: Gynecologic Oncology

## 2021-07-06 ENCOUNTER — Other Ambulatory Visit: Payer: Self-pay

## 2021-07-06 VITALS — BP 130/81 | HR 74 | Temp 98.1°F | Resp 16 | Ht 66.0 in | Wt 118.0 lb

## 2021-07-06 DIAGNOSIS — N9489 Other specified conditions associated with female genital organs and menstrual cycle: Secondary | ICD-10-CM

## 2021-07-06 DIAGNOSIS — D270 Benign neoplasm of right ovary: Secondary | ICD-10-CM

## 2021-07-06 DIAGNOSIS — D271 Benign neoplasm of left ovary: Secondary | ICD-10-CM

## 2021-07-06 DIAGNOSIS — Z90721 Acquired absence of ovaries, unilateral: Secondary | ICD-10-CM

## 2021-07-06 NOTE — Progress Notes (Signed)
Gynecologic Oncology Return Clinic Visit  07/06/21  Reason for Visit: follow-up after surgery  Treatment History: Robotic BSO, right ureterolysis on 06/08/21 performed in the setting of bilateral complex adnexal masses.   Interval History: The patient presents today for follow-up after recent surgery.  She notes overall doing well.  She has had some increased fatigue since surgery and some periods of feeling a little "spacey".  She saw Dr. Manuella Ghazi a couple of weeks ago who felt that this might be some mild decompensation related to hepatic encephalopathy.  He prescribed lactulose, which she started taking last week.  She denies any significant pain after surgery.  She reports regular bowel and bladder function.  She endorses a good appetite without nausea or emesis.  She denies any fevers or chills.  Past Medical/Surgical History: Past Medical History:  Diagnosis Date   Adnexal mass 05/22/2021   Anxiety    Breast cancer ([place]) 2004   left breast   Cirrhosis ([place])    Esophageal varices ([place])    Fatty liver    Hypercholesteremia    Hypertension    Hypothyroidism    Nonalcoholic steatohepatitis ([place])    Thyroid disease     Past Surgical History:  Procedure Laterality Date   ABDOMINOPLASTY  2004   APPENDECTOMY     AUGMENTATION MAMMAPLASTY Left 2004   following mastectomy   BREAST LUMPECTOMY  2004 X 2   CATARACT EXTRACTION     right eye 02/04/21, left eye 02-2021   COLONOSCOPY     2005,2019,   ESOPHAGOGASTRODUODENOSCOPY (EGD) WITH ESOPHAGEAL DILATION     2020and 2021   IR PARACENTESIS  04/19/2019   MASTECTOMY, PARTIAL Left 2012   ROBOTIC ASSISTED SALPINGO OOPHERECTOMY N/A 06/08/2021   Procedure: XI ROBOTIC [place];  Surgeon: Lafonda Mosses, MD;  Location: [facility];  Service: Gynecology;  Laterality: N/A;    Family History  Problem Relation Age of Onset   COPD Father    Ovarian cancer Maternal Grandmother    Ovarian cancer Cousin    Colon  cancer Neg Hx    Breast cancer Neg Hx    Pancreatic cancer Neg Hx    Prostate cancer Neg Hx     Social History   Socioeconomic History   Marital status: Divorced    Spouse name: Not on file   Number of children: 1   Years of education: Not on file   Highest education level: Not on file  Occupational History   Not on file  Tobacco Use   Smoking status: Former    Packs/day: 0.50    Years: 25.00    Pack years: 12.50    Types: Cigarettes    Quit date: 02/29/1992    Years since quitting: 29.3   Smokeless tobacco: Never  Vaping Use   Vaping Use: Never used  Substance and Sexual Activity   Alcohol use: Not Currently    Comment: quit EtOH use in 2020   Drug use: No   Sexual activity: Not Currently    Partners: Male    Birth control/protection: Post-menopausal  Other Topics Concern   Not on file  Social History Narrative   Not on file   Social Determinants of Health   Financial Resource Strain: Not on file  Food Insecurity: Not on file  Transportation Needs: Not on file  Physical Activity: Not on file  Stress: Not on file  Social Connections: Not on file    Current Medications:  Current Outpatient Medications:  Calcium Citrate-Vitamin D (CITRACAL + D PO), Take 2 tablets by mouth daily., Disp: , Rfl:    carvedilol (COREG) 6.25 MG tablet, Take 6.25 mg by mouth daily., Disp: , Rfl:    ferrous sulfate 325 (65 FE) MG tablet, Take 325 mg by mouth daily., Disp: , Rfl:    furosemide (LASIX) 40 MG tablet, Take 20 mg by mouth daily., Disp: , Rfl:    levothyroxine (SYNTHROID) 50 MCG tablet, Take 50 mcg by mouth every morning., Disp: , Rfl:    nitrofurantoin, macrocrystal-monohydrate, (MACROBID) 100 MG capsule, Take 1 capsule (100 mg total) by mouth 2 (two) times daily., Disp: 10 capsule, Rfl: 0   senna-docusate (SENOKOT-S) 8.6-50 MG tablet, Take 2 tablets by mouth at bedtime. For AFTER surgery, do not take if having diarrhea, Disp: 30 tablet, Rfl: 0   spironolactone  (ALDACTONE) 50 MG tablet, Take 50 mg by mouth daily., Disp: , Rfl:    traMADol (ULTRAM) 50 MG tablet, Take 1 tablet (50 mg total) by mouth every 6 (six) hours as needed for severe pain. For AFTER surgery, do not take and drive, Disp: 10 tablet, Rfl: 0  Review of Systems: Pertinent positives include fatigue and anxiety. Denies appetite changes, fevers, chills, unexplained weight changes. Denies hearing loss, neck lumps or masses, mouth sores, ringing in ears or voice changes. Denies cough or wheezing.  Denies shortness of breath. Denies chest pain or palpitations. Denies leg swelling. Denies abdominal distention, pain, blood in stools, constipation, diarrhea, nausea, vomiting, or early satiety. Denies pain with intercourse, dysuria, frequency, hematuria or incontinence. Denies hot flashes, pelvic pain, vaginal bleeding or vaginal discharge.   Denies joint pain, back pain or muscle pain/cramps. Denies itching, rash, or wounds. Denies dizziness, headaches, numbness or seizures. Denies swollen lymph nodes or glands, denies easy bruising or bleeding. Denies depression, confusion, or decreased concentration.  Physical Exam: BP 130/81 (BP Location: Right Arm, Patient Position: Sitting)   Pulse 74   Temp 98.1 F (36.7 C) (Tympanic)   Resp 16   Ht 5' 6"  (1.676 m)   Wt 118 lb (53.5 kg)   LMP 07/14/2003   SpO2 100%   BMI 19.05 kg/m  General: Alert, oriented, no acute distress. HEENT: Posterior oropharynx clear, sclera anicteric. Chest: Unlabored breathing on room air. Abdomen: soft, nontender.  Normoactive bowel sounds.  No masses or hepatosplenomegaly appreciated.  Well-healed laparoscopic incisions, remaining Dermabond removed. Extremities: Grossly normal range of motion.  Warm, well perfused.  No edema bilaterally.  Laboratory & Radiologic Studies: A. OVARY AND TUBE, RIGHT, SALPINGO OOPHORECTOMY:  - Serous cystadenoma.  - No borderline change or malignancy.  - Benign Fallopian tube  with mild chronic salpingitis.   B. OVARY AND TUBE, LEFT, SALPINGO OOPHORECTOMY:  - Serous cystadenofibroma with calcifications.  - No borderline change or malignancy.  - Benign Fallopian tube with mild hydrosalpinx.   C. PELVIC SIDEWALL, RIGHT, BIOPSY:  - Benign peritoneum and adipose tissue with vascular associated  calcifications.  - No endometriosis or malignancy.   Assessment & Plan: Joyce Griffin is a 70 y.o. woman who is now approximately 1 month status post robotic BSO in the setting of complex adnexal masses with benign pathology.  The patient is doing very well from a postoperative standpoint.  She has had some fatigue as well as mild alteration in mental status, thought perhaps to be related for mild decompensation of her cirrhosis.  She was started on lactulose by her hepatologist.  She overall feels that this is improving.  We discussed continued postoperative expectations and restrictions.  Patient was given a printed copy of her pathology report.  Discussed that she still has a uterus and cervix and should follow-up with GYN for routine GYN health care.  28 minutes of total time was spent for this patient encounter, including preparation, face-to-face counseling with the patient and coordination of care, and documentation of the encounter.  Jeral Pinch, MD  Division of Gynecologic Oncology  Department of Obstetrics and Gynecology  [facility]

## 2021-07-06 NOTE — Patient Instructions (Signed)
Was great to see you today!  You are healing well from surgery.  Lifting restrictions are in place until you are 6 weeks out from surgery.  Please let me know if you need anything in the future, otherwise, you can follow-up with your gynecologist for routine gynecologic care.

## 2021-07-13 ENCOUNTER — Telehealth: Payer: Self-pay

## 2021-07-13 NOTE — Telephone Encounter (Signed)
Told ms Brocksmith that she needs to follow up with her liver doctor if she needs a root canal.  Pt stated that the tooth will need to be pulled.  She is still having some spotting on the liner about a quarter size 2-3 times a day.  She did lift her mattress to change the sheets Friday evening. The the spotting began Saturday. Told Ms  Miron to monitor spotting. If she has any bleeding like a period, she needs to call.  She needs to take it easy especially with lifting restrictions  as discussed at post op appointment 07-06-21.

## 2021-07-20 DIAGNOSIS — Z961 Presence of intraocular lens: Secondary | ICD-10-CM | POA: Diagnosis not present

## 2021-09-14 DIAGNOSIS — Z23 Encounter for immunization: Secondary | ICD-10-CM | POA: Diagnosis not present

## 2021-09-24 DIAGNOSIS — Z23 Encounter for immunization: Secondary | ICD-10-CM | POA: Diagnosis not present

## 2021-10-02 ENCOUNTER — Other Ambulatory Visit: Payer: Self-pay | Admitting: Gastroenterology

## 2021-10-02 DIAGNOSIS — K7030 Alcoholic cirrhosis of liver without ascites: Secondary | ICD-10-CM

## 2021-10-05 ENCOUNTER — Other Ambulatory Visit: Payer: Self-pay | Admitting: Orthopedic Surgery

## 2021-10-14 ENCOUNTER — Ambulatory Visit
Admission: RE | Admit: 2021-10-14 | Discharge: 2021-10-14 | Disposition: A | Discharge status: Home / Self Care | Payer: Medicare Other | Source: Ambulatory Visit | Attending: Gastroenterology | Admitting: Gastroenterology

## 2021-10-14 DIAGNOSIS — K7689 Other specified diseases of liver: Secondary | ICD-10-CM | POA: Diagnosis not present

## 2021-10-14 DIAGNOSIS — K8020 Calculus of gallbladder without cholecystitis without obstruction: Secondary | ICD-10-CM | POA: Diagnosis not present

## 2021-10-14 DIAGNOSIS — K7030 Alcoholic cirrhosis of liver without ascites: Secondary | ICD-10-CM

## 2021-10-14 DIAGNOSIS — N289 Disorder of kidney and ureter, unspecified: Secondary | ICD-10-CM | POA: Diagnosis not present

## 2021-11-12 DIAGNOSIS — K7030 Alcoholic cirrhosis of liver without ascites: Secondary | ICD-10-CM | POA: Diagnosis not present

## 2021-12-16 DIAGNOSIS — K7031 Alcoholic cirrhosis of liver with ascites: Secondary | ICD-10-CM | POA: Diagnosis not present

## 2021-12-16 DIAGNOSIS — R188 Other ascites: Secondary | ICD-10-CM | POA: Diagnosis not present

## 2021-12-16 DIAGNOSIS — K7460 Unspecified cirrhosis of liver: Secondary | ICD-10-CM | POA: Diagnosis not present

## 2021-12-16 DIAGNOSIS — Z79890 Hormone replacement therapy (postmenopausal): Secondary | ICD-10-CM | POA: Diagnosis not present

## 2021-12-16 DIAGNOSIS — I8510 Secondary esophageal varices without bleeding: Secondary | ICD-10-CM | POA: Diagnosis not present

## 2021-12-16 DIAGNOSIS — K7581 Nonalcoholic steatohepatitis (NASH): Secondary | ICD-10-CM | POA: Diagnosis not present

## 2021-12-16 DIAGNOSIS — Z885 Allergy status to narcotic agent status: Secondary | ICD-10-CM | POA: Diagnosis not present

## 2021-12-16 DIAGNOSIS — Z79899 Other long term (current) drug therapy: Secondary | ICD-10-CM | POA: Diagnosis not present

## 2021-12-16 DIAGNOSIS — K7682 Hepatic encephalopathy: Secondary | ICD-10-CM | POA: Diagnosis not present

## 2021-12-16 DIAGNOSIS — D509 Iron deficiency anemia, unspecified: Secondary | ICD-10-CM | POA: Diagnosis not present

## 2021-12-16 DIAGNOSIS — I10 Essential (primary) hypertension: Secondary | ICD-10-CM | POA: Diagnosis not present

## 2021-12-16 DIAGNOSIS — E785 Hyperlipidemia, unspecified: Secondary | ICD-10-CM | POA: Diagnosis not present

## 2021-12-16 DIAGNOSIS — F1011 Alcohol abuse, in remission: Secondary | ICD-10-CM | POA: Diagnosis not present

## 2022-02-22 DIAGNOSIS — E7800 Pure hypercholesterolemia, unspecified: Secondary | ICD-10-CM | POA: Diagnosis not present

## 2022-02-22 DIAGNOSIS — M8580 Other specified disorders of bone density and structure, unspecified site: Secondary | ICD-10-CM | POA: Diagnosis not present

## 2022-02-22 DIAGNOSIS — E039 Hypothyroidism, unspecified: Secondary | ICD-10-CM | POA: Diagnosis not present

## 2022-02-22 DIAGNOSIS — Z0000 Encounter for general adult medical examination without abnormal findings: Secondary | ICD-10-CM | POA: Diagnosis not present

## 2022-02-22 DIAGNOSIS — Z1231 Encounter for screening mammogram for malignant neoplasm of breast: Secondary | ICD-10-CM | POA: Diagnosis not present

## 2022-02-22 DIAGNOSIS — I10 Essential (primary) hypertension: Secondary | ICD-10-CM | POA: Diagnosis not present

## 2022-02-22 DIAGNOSIS — K7030 Alcoholic cirrhosis of liver without ascites: Secondary | ICD-10-CM | POA: Diagnosis not present

## 2022-02-22 DIAGNOSIS — Z1389 Encounter for screening for other disorder: Secondary | ICD-10-CM | POA: Diagnosis not present

## 2022-02-22 DIAGNOSIS — K7581 Nonalcoholic steatohepatitis (NASH): Secondary | ICD-10-CM | POA: Diagnosis not present

## 2022-02-24 ENCOUNTER — Other Ambulatory Visit: Payer: Self-pay | Admitting: Internal Medicine

## 2022-02-24 DIAGNOSIS — M8580 Other specified disorders of bone density and structure, unspecified site: Secondary | ICD-10-CM

## 2022-02-25 ENCOUNTER — Other Ambulatory Visit: Payer: Self-pay | Admitting: Internal Medicine

## 2022-03-05 ENCOUNTER — Other Ambulatory Visit: Payer: Self-pay | Admitting: Internal Medicine

## 2022-03-05 ENCOUNTER — Ambulatory Visit
Admission: RE | Admit: 2022-03-05 | Discharge: 2022-03-05 | Disposition: A | Discharge status: Home / Self Care | Payer: Medicare Other | Source: Ambulatory Visit | Attending: Internal Medicine | Admitting: Internal Medicine

## 2022-03-05 DIAGNOSIS — Z1231 Encounter for screening mammogram for malignant neoplasm of breast: Secondary | ICD-10-CM

## 2022-03-25 ENCOUNTER — Other Ambulatory Visit: Payer: Self-pay | Admitting: Gastroenterology

## 2022-03-25 DIAGNOSIS — K7030 Alcoholic cirrhosis of liver without ascites: Secondary | ICD-10-CM

## 2022-03-29 ENCOUNTER — Other Ambulatory Visit: Payer: Self-pay | Admitting: Orthopedic Surgery

## 2022-03-30 ENCOUNTER — Ambulatory Visit
Admission: RE | Admit: 2022-03-30 | Discharge: 2022-03-30 | Disposition: A | Discharge status: Home / Self Care | Payer: Medicare Other | Source: Ambulatory Visit | Attending: Gastroenterology | Admitting: Gastroenterology

## 2022-03-30 DIAGNOSIS — K7030 Alcoholic cirrhosis of liver without ascites: Secondary | ICD-10-CM

## 2022-03-30 DIAGNOSIS — K7689 Other specified diseases of liver: Secondary | ICD-10-CM | POA: Diagnosis not present

## 2022-03-30 DIAGNOSIS — K8020 Calculus of gallbladder without cholecystitis without obstruction: Secondary | ICD-10-CM | POA: Diagnosis not present

## 2022-03-30 DIAGNOSIS — N2889 Other specified disorders of kidney and ureter: Secondary | ICD-10-CM | POA: Diagnosis not present

## 2022-03-30 DIAGNOSIS — K7460 Unspecified cirrhosis of liver: Secondary | ICD-10-CM | POA: Diagnosis not present

## 2022-03-31 ENCOUNTER — Other Ambulatory Visit: Payer: Medicare Other

## 2022-04-01 ENCOUNTER — Ambulatory Visit
Admission: RE | Admit: 2022-04-01 | Discharge: 2022-04-01 | Disposition: A | Discharge status: Home / Self Care | Payer: Medicare Other | Source: Ambulatory Visit | Attending: Internal Medicine | Admitting: Internal Medicine

## 2022-04-01 DIAGNOSIS — M8580 Other specified disorders of bone density and structure, unspecified site: Secondary | ICD-10-CM

## 2022-04-01 DIAGNOSIS — Z780 Asymptomatic menopausal state: Secondary | ICD-10-CM | POA: Diagnosis not present

## 2022-04-01 DIAGNOSIS — M810 Age-related osteoporosis without current pathological fracture: Secondary | ICD-10-CM | POA: Diagnosis not present

## 2022-06-03 IMAGING — US US ABDOMEN COMPLETE
1 series · 14 of 25 positions shown · non-contrast
Comparison: April 01, 2020

CLINICAL DATA: Cirrhosis.

EXAM:
ABDOMEN ULTRASOUND COMPLETE

[Series 1: us abdomen complete · 0.17mm/px · 14 of 95 slices shown]
[im 1/95]
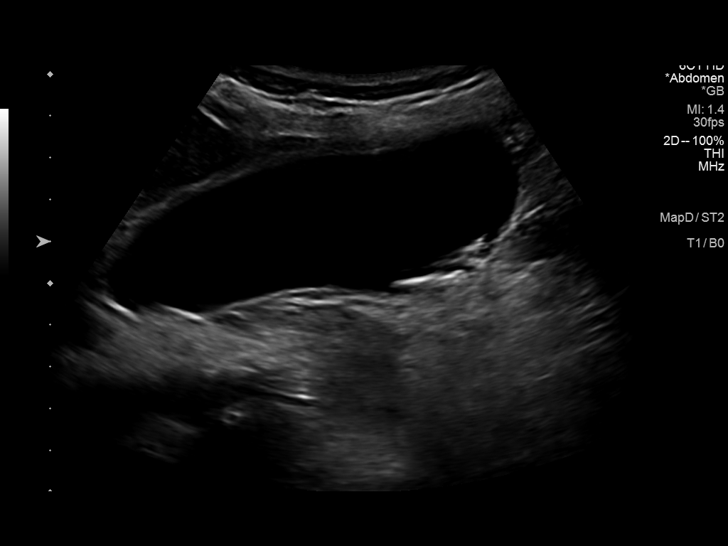
[im 8/95]
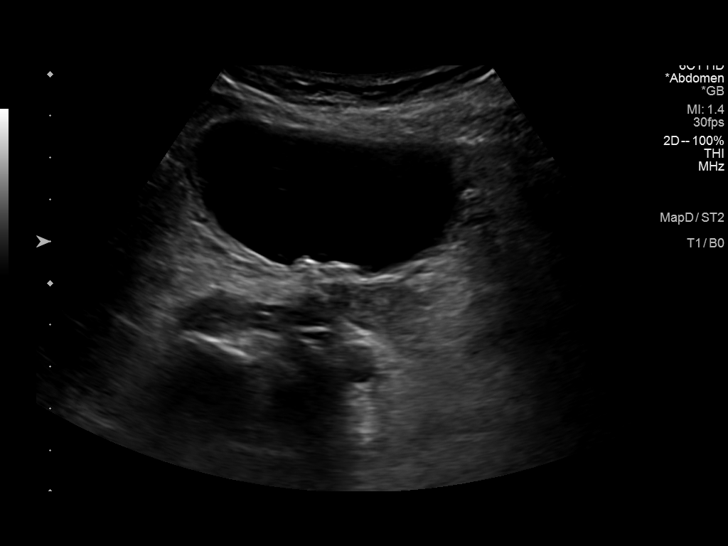
[im 16/95]
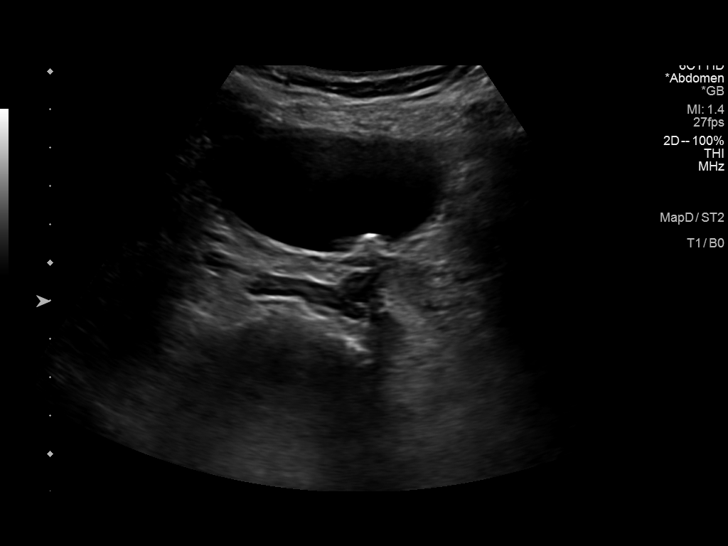
[im 24/95]
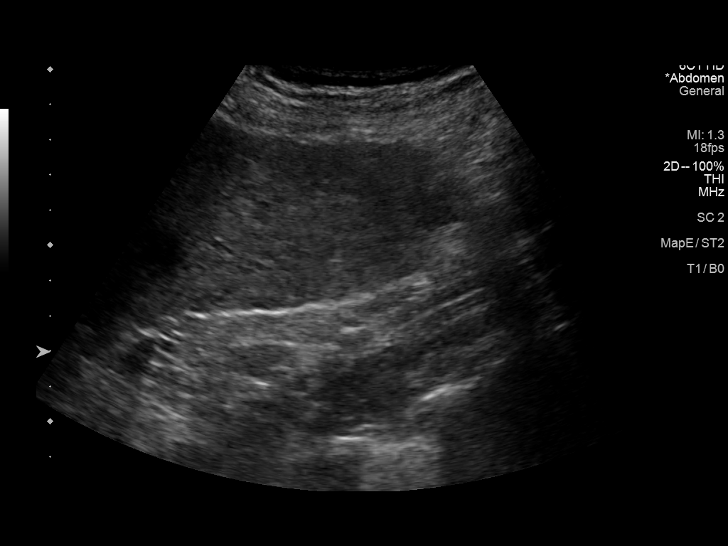
[im 32/95]
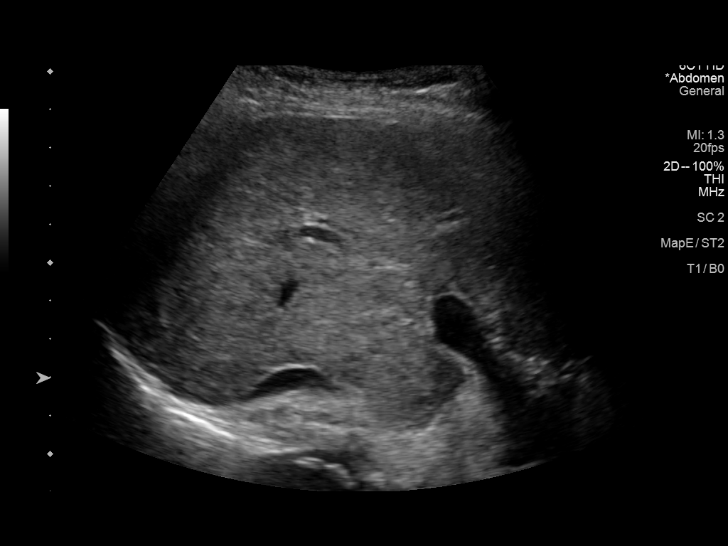
[im 36/95]
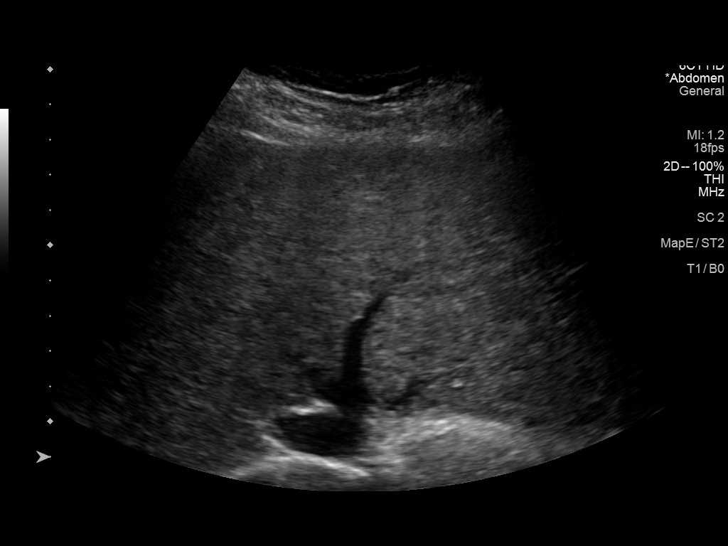
[im 44/95]
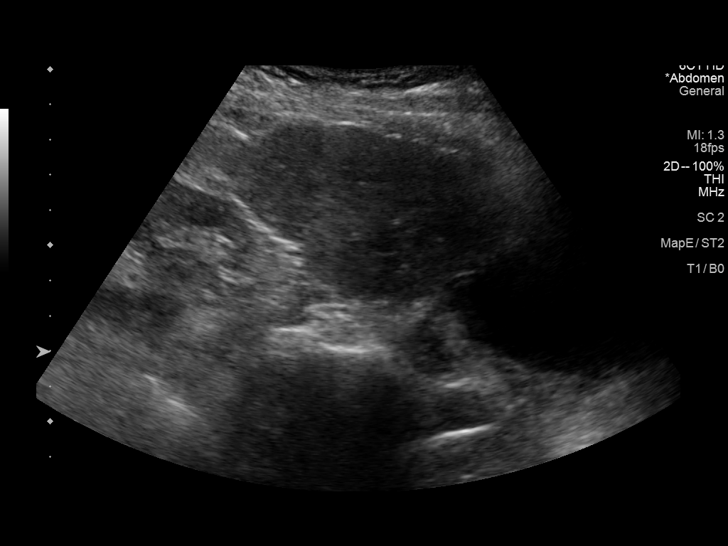
[im 51/95]
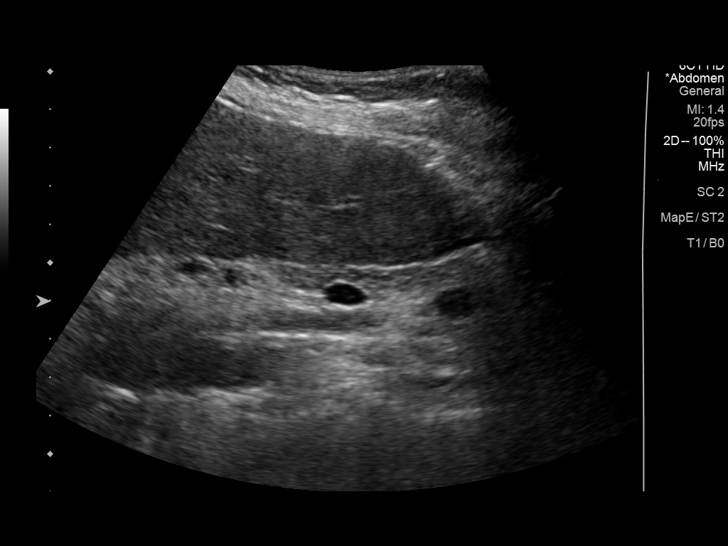
[im 59/95]
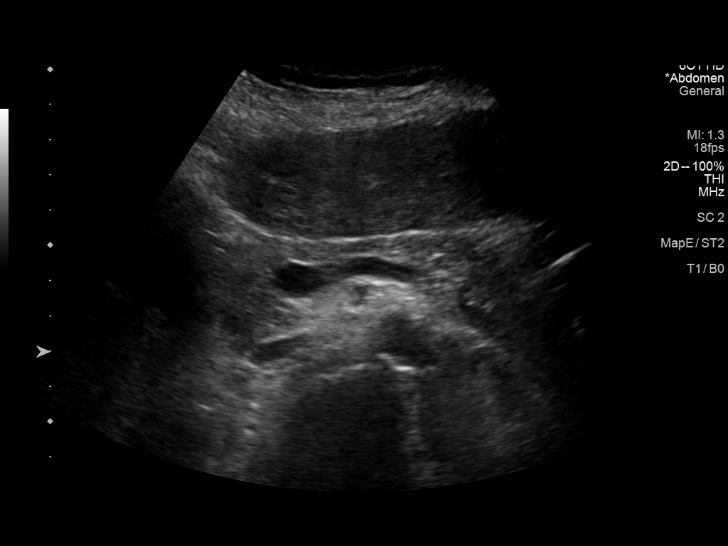
[im 63/95]
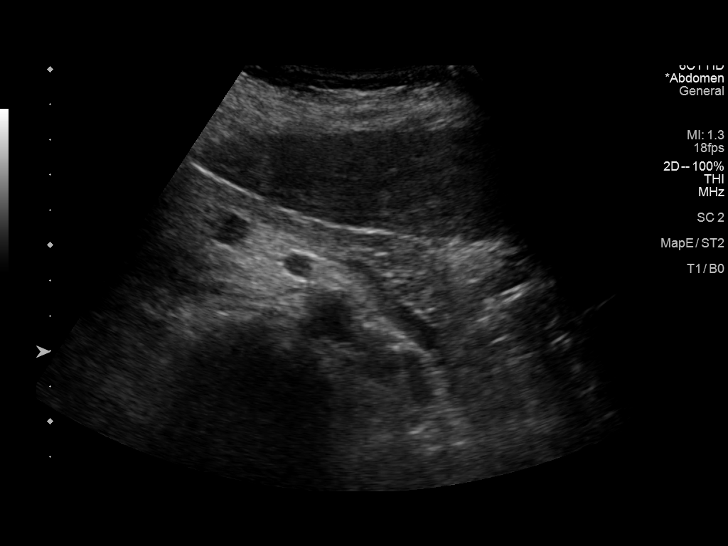
[im 71/95]
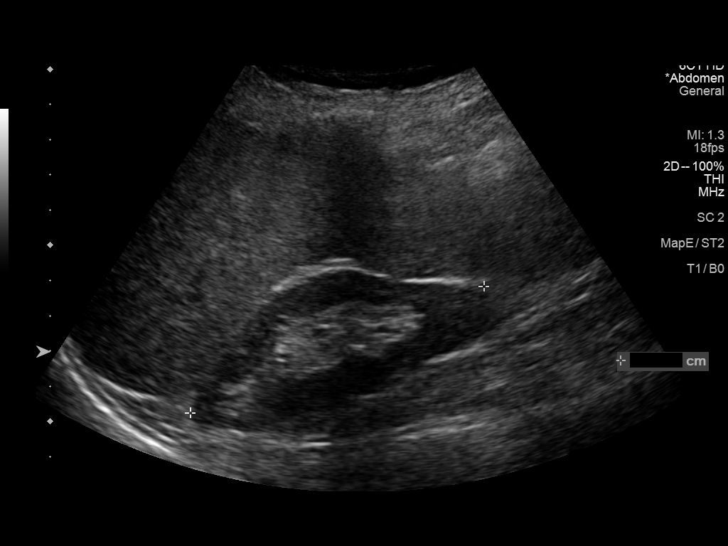
[im 79/95]
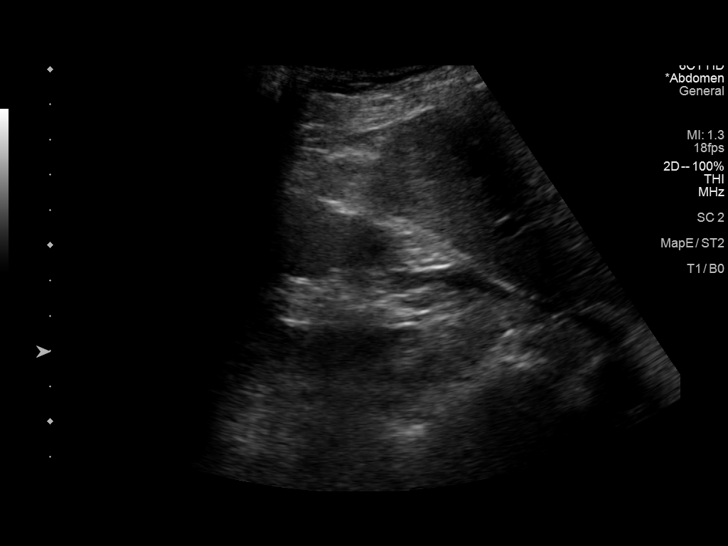
[im 87/95]
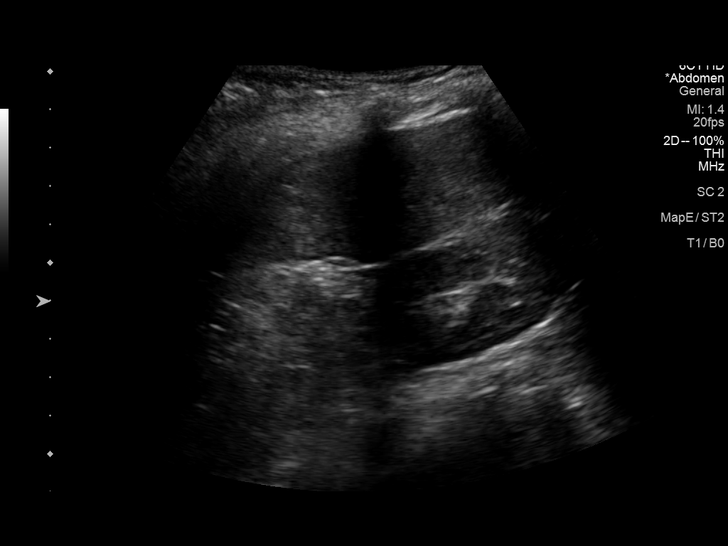
[im 95/95]
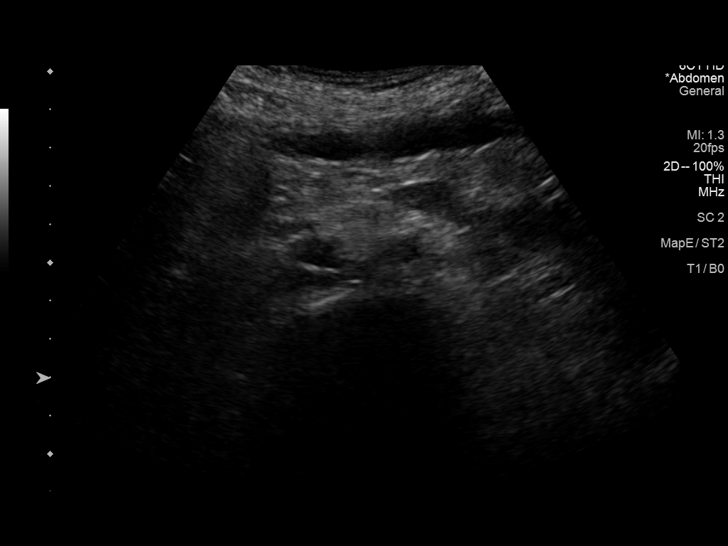

[14 of 25 positions shown; findings below may reference images not displayed]

FINDINGS: Gallbladder: Mobile shadowing echogenic gallstones are seen within
the gallbladder lumen (the largest measures 1.4 cm). There is no
evidence of gallbladder wall thickening (2.6 mm). No sonographic
Murphy sign noted by sonographer.

Common bile duct: Diameter: 7.3 mm

Liver: No focal lesion identified. There is heterogeneously
increased echogenicity of the liver parenchyma which demonstrates
coarse irregular borders. Portal vein is patent on color Doppler
imaging with normal direction of blood flow towards the liver.

IVC: No abnormality visualized.

Pancreas: Visualized portion unremarkable.

Spleen: Size (14.0 cm) and appearance within normal limits.

Right Kidney: Length: 9.1 cm. Echogenicity within normal limits. No
mass or hydronephrosis visualized.

Left Kidney: Length: 12.0 cm. Echogenicity within normal limits. No
mass or hydronephrosis visualized.

Abdominal aorta: No aneurysm visualized.

Other findings: None.
IMPRESSION: 1. Cholelithiasis, without evidence of acute cholecystitis.
2. Fatty, cirrhotic liver.

## 2022-06-17 DIAGNOSIS — F1011 Alcohol abuse, in remission: Secondary | ICD-10-CM | POA: Diagnosis not present

## 2022-06-17 DIAGNOSIS — K7031 Alcoholic cirrhosis of liver with ascites: Secondary | ICD-10-CM | POA: Diagnosis not present

## 2022-06-24 DIAGNOSIS — K7031 Alcoholic cirrhosis of liver with ascites: Secondary | ICD-10-CM | POA: Diagnosis not present

## 2022-07-05 DIAGNOSIS — K7581 Nonalcoholic steatohepatitis (NASH): Secondary | ICD-10-CM | POA: Diagnosis not present

## 2022-07-05 DIAGNOSIS — K7460 Unspecified cirrhosis of liver: Secondary | ICD-10-CM | POA: Diagnosis not present

## 2022-07-20 DIAGNOSIS — Z961 Presence of intraocular lens: Secondary | ICD-10-CM | POA: Diagnosis not present

## 2022-07-27 DIAGNOSIS — K7030 Alcoholic cirrhosis of liver without ascites: Secondary | ICD-10-CM | POA: Diagnosis not present

## 2022-07-27 DIAGNOSIS — Z1211 Encounter for screening for malignant neoplasm of colon: Secondary | ICD-10-CM | POA: Diagnosis not present

## 2022-09-04 DIAGNOSIS — Z23 Encounter for immunization: Secondary | ICD-10-CM | POA: Diagnosis not present

## 2022-09-30 ENCOUNTER — Other Ambulatory Visit: Payer: Self-pay | Admitting: Gastroenterology

## 2022-09-30 DIAGNOSIS — K7030 Alcoholic cirrhosis of liver without ascites: Secondary | ICD-10-CM

## 2022-10-06 ENCOUNTER — Ambulatory Visit
Admission: RE | Admit: 2022-10-06 | Discharge: 2022-10-06 | Disposition: A | Discharge status: Home / Self Care | Payer: Medicare Other | Source: Ambulatory Visit | Attending: Gastroenterology | Admitting: Gastroenterology

## 2022-10-06 DIAGNOSIS — K7030 Alcoholic cirrhosis of liver without ascites: Secondary | ICD-10-CM

## 2022-10-06 DIAGNOSIS — R161 Splenomegaly, not elsewhere classified: Secondary | ICD-10-CM | POA: Diagnosis not present

## 2022-10-06 DIAGNOSIS — K8020 Calculus of gallbladder without cholecystitis without obstruction: Secondary | ICD-10-CM | POA: Diagnosis not present

## 2022-10-06 DIAGNOSIS — N2889 Other specified disorders of kidney and ureter: Secondary | ICD-10-CM | POA: Diagnosis not present

## 2022-10-06 DIAGNOSIS — K7460 Unspecified cirrhosis of liver: Secondary | ICD-10-CM | POA: Diagnosis not present

## 2022-10-13 DIAGNOSIS — Z853 Personal history of malignant neoplasm of breast: Secondary | ICD-10-CM | POA: Diagnosis not present

## 2022-10-14 ENCOUNTER — Telehealth: Payer: Self-pay | Admitting: Genetic Counselor

## 2022-10-14 NOTE — Telephone Encounter (Signed)
Scheduled appointment per 11/02. Patient is aware of appointment date and time. Patient is aware to arrive 15 mins prior to appointment time and to bring updated insurance cards. Patient is aware of location.

## 2022-12-17 DIAGNOSIS — I8510 Secondary esophageal varices without bleeding: Secondary | ICD-10-CM | POA: Diagnosis not present

## 2022-12-17 DIAGNOSIS — K7460 Unspecified cirrhosis of liver: Secondary | ICD-10-CM | POA: Diagnosis not present

## 2022-12-17 DIAGNOSIS — K7581 Nonalcoholic steatohepatitis (NASH): Secondary | ICD-10-CM | POA: Diagnosis not present

## 2022-12-17 DIAGNOSIS — K7682 Hepatic encephalopathy: Secondary | ICD-10-CM | POA: Diagnosis not present

## 2022-12-17 IMAGING — US US ABDOMEN COMPLETE
1 series · 13 of 25 positions shown · non-contrast
Comparison: 10/06/2020

04/01/2020

CLINICAL DATA: Alcoholic cirrhosis of the liver without ascites

EXAM:
ABDOMEN ULTRASOUND COMPLETE

[Series 1: us abdomen complete · 0.15mm/px · 13 of 91 slices shown]
[im 1/91]
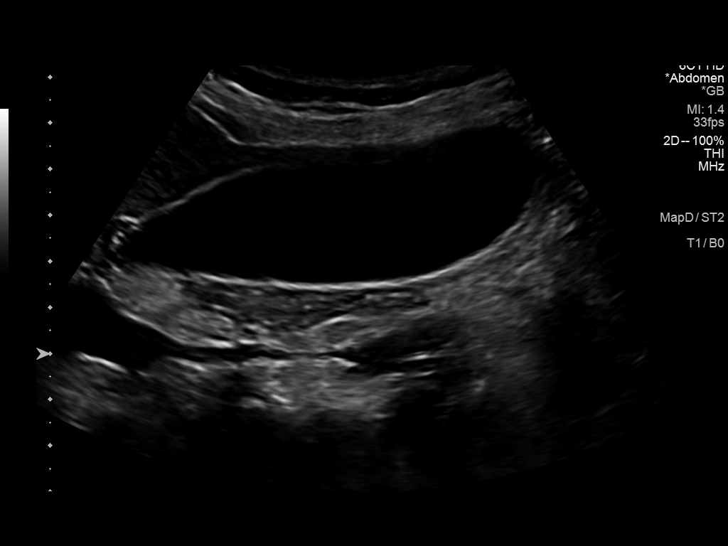
[im 8/91]
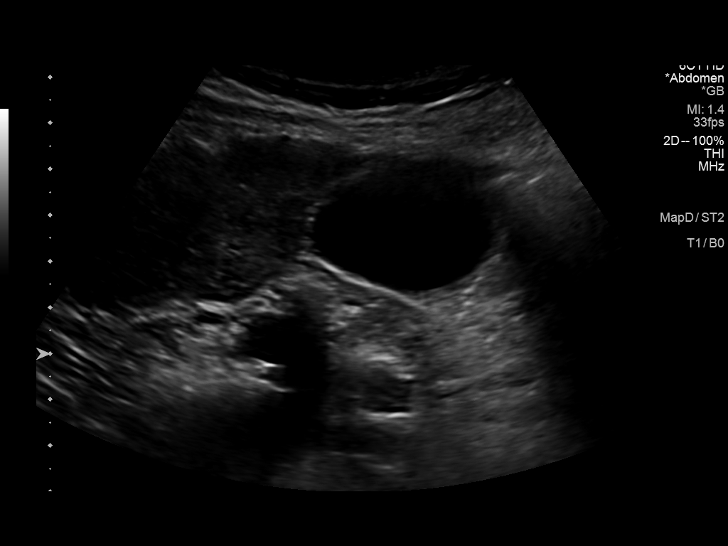
[im 16/91]
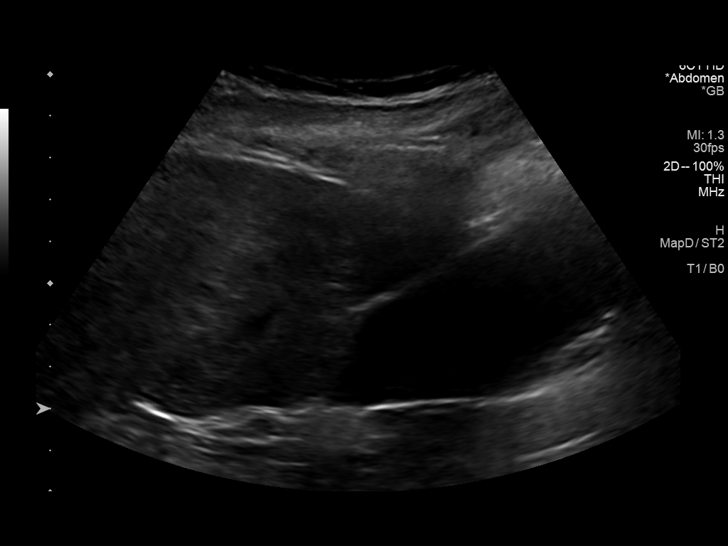
[im 23/91]
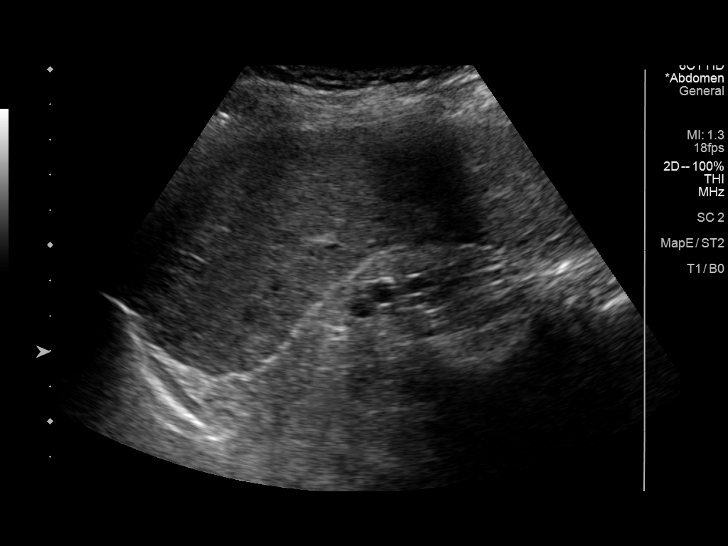
[im 31/91]
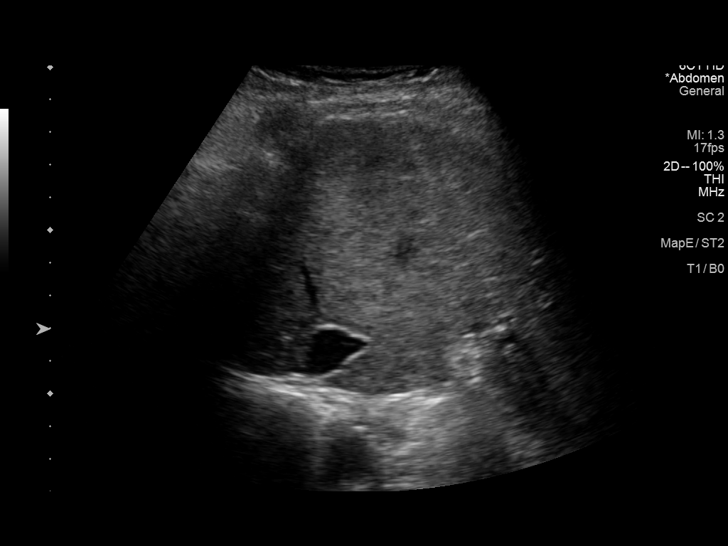
[im 38/91]
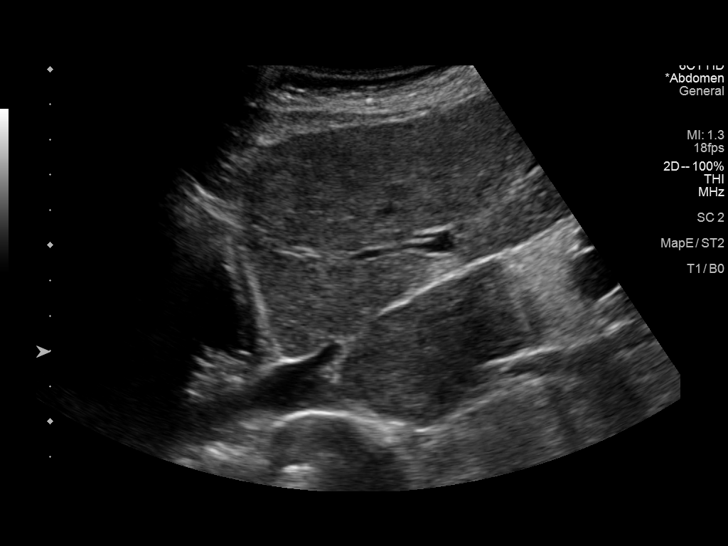
[im 46/91]
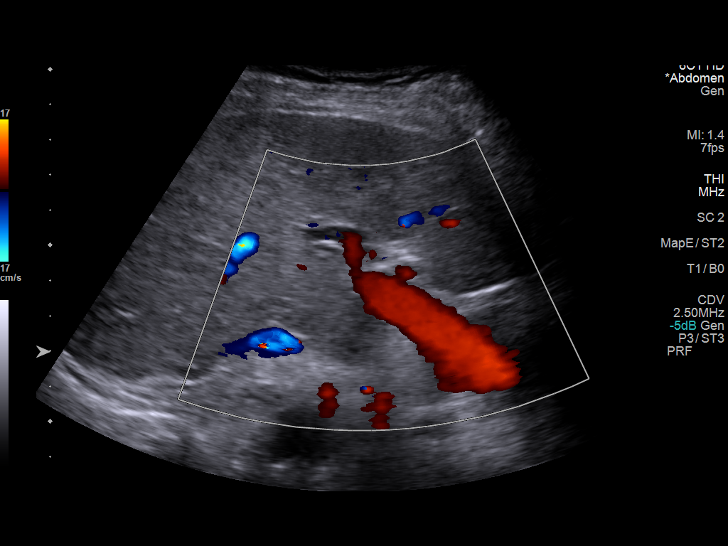
[im 53/91]
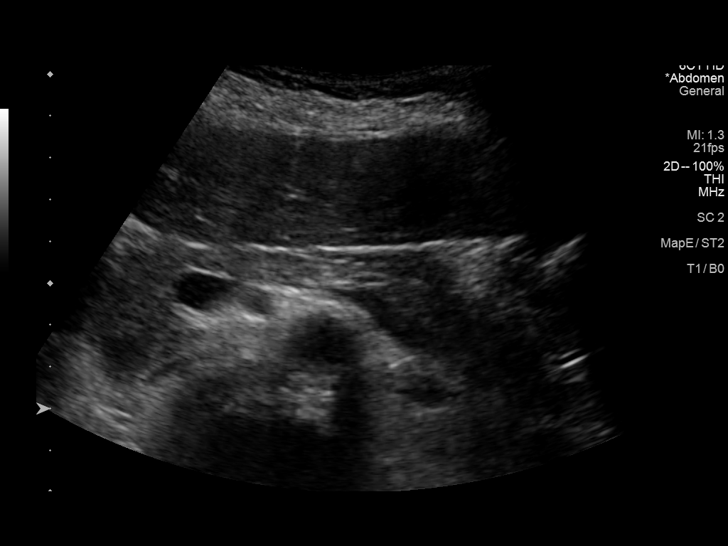
[im 61/91]
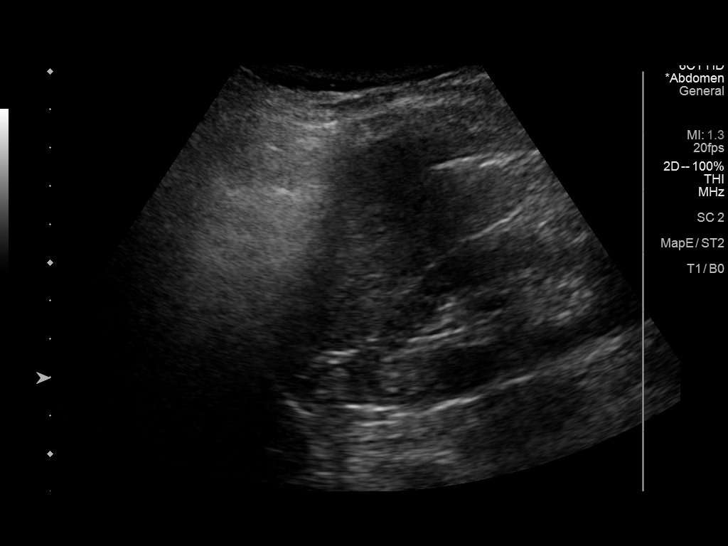
[im 68/91]
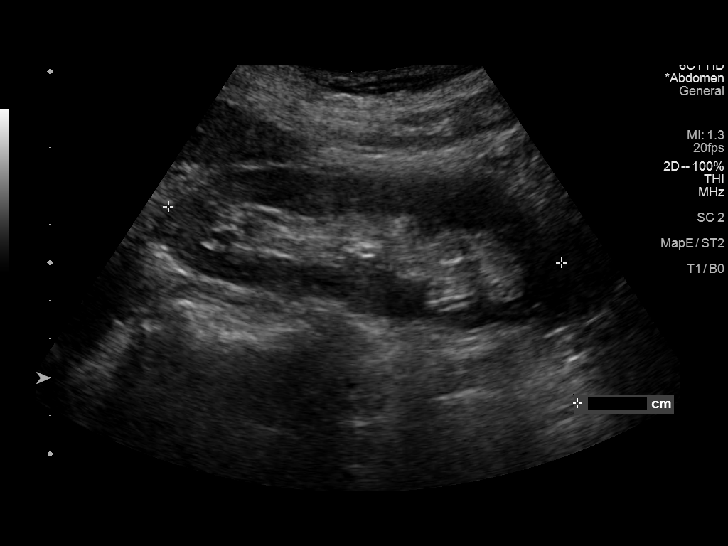
[im 76/91]
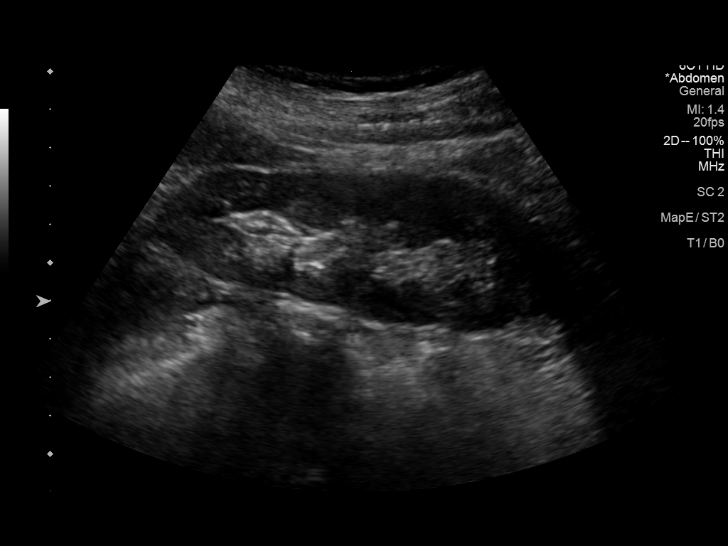
[im 83/91]
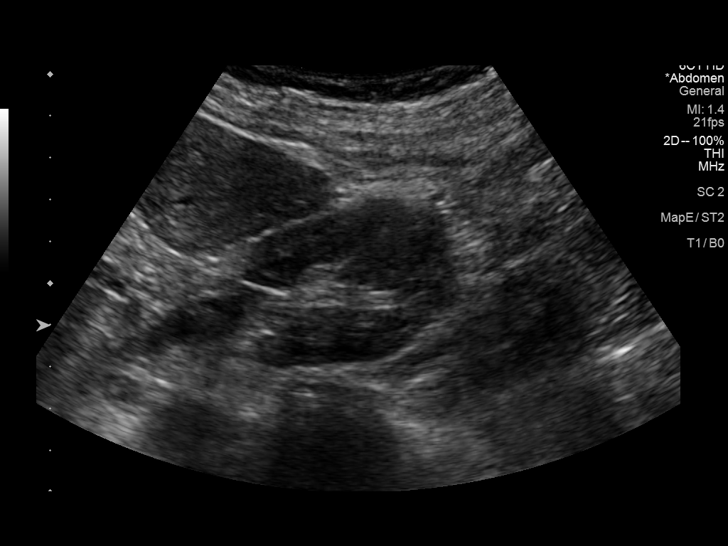
[im 91/91]
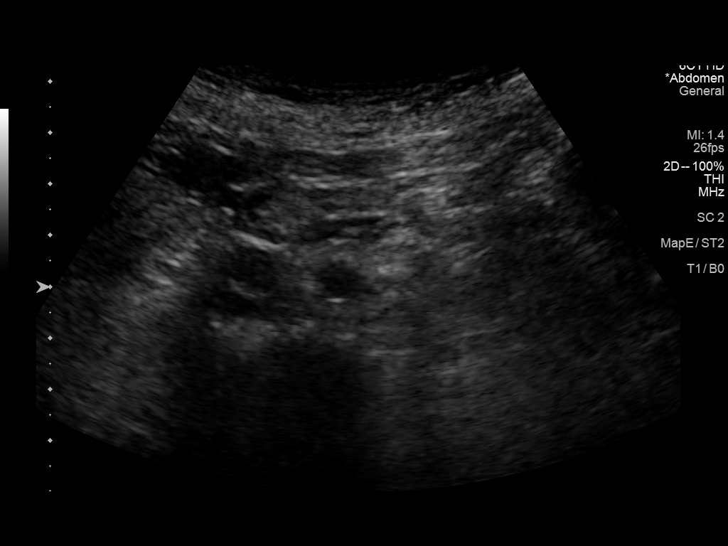

[13 of 25 positions shown; findings below may reference images not displayed]

FINDINGS: Gallbladder: Minimal cholelithiasis. No gallbladder wall thickening.
No pericholecystic fluid. No sonographic Murphy sign noted by
sonographer.

Common bile duct: Diameter: 5 mm

Liver: Coarsened parenchymal echogenicity is consistent with
cirrhosis. No focal hepatic lesion. Portal vein is patent on color
Doppler imaging with normal direction of blood flow towards the
liver.

IVC: No abnormality visualized.

Pancreas: Visualized portion unremarkable.

Spleen: Spleen is enlarged measuring 13.4 cm in longest dimension
with a volume of 696 mL.

Right Kidney: Length: 9.9 cm. Echogenicity within normal limits. No
mass or hydronephrosis visualized.

Left Kidney: Length: 10.4 cm. Echogenicity within normal limits. No
mass or hydronephrosis visualized. 0.7 x 0.5 x 0.4 cm echogenic
structure noted in the left kidney.

Abdominal aorta: No aneurysm visualized.

Other findings: None.
IMPRESSION: 1. Coarsened echotexture of the patent parenchyma consistent with
cirrhosis. No focal hepatic lesion.
2. 0.7 x 0.5 x 0.4 cm in the left kidney is new since prior
examinations. This may be related to an angiomyolipoma given its
echogenicity, however renal cell carcinoma can also appear
echogenic. Further evaluation with contrast enhanced CT or MRI
should be performed.

These results will be called to the ordering clinician or
representative by the Radiologist Assistant, and communication
documented in the PACS or [REDACTED].

## 2022-12-21 ENCOUNTER — Encounter: Payer: Self-pay | Admitting: Genetic Counselor

## 2022-12-21 ENCOUNTER — Inpatient Hospital Stay: Payer: Medicare Other | Attending: Genetic Counselor | Admitting: Genetic Counselor

## 2022-12-21 ENCOUNTER — Other Ambulatory Visit: Payer: Self-pay

## 2022-12-21 ENCOUNTER — Inpatient Hospital Stay: Payer: Medicare Other

## 2022-12-21 ENCOUNTER — Other Ambulatory Visit: Payer: Self-pay | Admitting: Genetic Counselor

## 2022-12-21 DIAGNOSIS — Z801 Family history of malignant neoplasm of trachea, bronchus and lung: Secondary | ICD-10-CM | POA: Diagnosis not present

## 2022-12-21 DIAGNOSIS — Z853 Personal history of malignant neoplasm of breast: Secondary | ICD-10-CM

## 2022-12-21 DIAGNOSIS — Z809 Family history of malignant neoplasm, unspecified: Secondary | ICD-10-CM

## 2022-12-21 DIAGNOSIS — Z803 Family history of malignant neoplasm of breast: Secondary | ICD-10-CM | POA: Diagnosis not present

## 2022-12-21 DIAGNOSIS — Z808 Family history of malignant neoplasm of other organs or systems: Secondary | ICD-10-CM | POA: Diagnosis not present

## 2022-12-21 DIAGNOSIS — Z8041 Family history of malignant neoplasm of ovary: Secondary | ICD-10-CM | POA: Diagnosis not present

## 2022-12-21 DIAGNOSIS — Z1379 Encounter for other screening for genetic and chromosomal anomalies: Secondary | ICD-10-CM

## 2022-12-21 DIAGNOSIS — Z800 Family history of malignant neoplasm of digestive organs: Secondary | ICD-10-CM | POA: Diagnosis not present

## 2022-12-21 DIAGNOSIS — C50912 Malignant neoplasm of unspecified site of left female breast: Secondary | ICD-10-CM

## 2022-12-21 LAB — GENETIC SCREENING ORDER

## 2022-12-21 NOTE — Progress Notes (Signed)
REFERRING PROVIDER: Christophe Louis, MD [place] [address] [place],  [place] [address]  PRIMARY PROVIDER:  Seward Carol, MD  PRIMARY REASON FOR VISIT:  1. Malignant neoplasm of left breast in female, estrogen receptor positive, unspecified site of breast ([place])    HISTORY OF PRESENT ILLNESS:   Joyce Griffin, a 72 y.o. female, was seen for a [facility] cancer genetics consultation at the request of Dr. Landry Mellow due to a personal and family history of cancer.  Joyce Griffin presents to clinic today to discuss the possibility of a hereditary predisposition to cancer, to discuss genetic testing, and to further clarify her future cancer risks, as well as potential cancer risks for family members.   CANCER HISTORY:  Joyce Griffin was diagnosed with breast cancer at age 67.  RISK FACTORS:  OCP use for approximately 5 years.  Ovaries intact: yes.  Uterus intact: no.  Menopausal status: postmenopausal.  HRT use: 0 years. Colonoscopy: yes;  reports a couple benign polyps . Mammogram within the last year: yes. Up to date with pelvic exams: yes. Any excessive radiation exposure in the past: no  Past Medical History:  Diagnosis Date   Adnexal mass 05/22/2021   Anxiety    Breast cancer ([place]) 2004   left breast   Cirrhosis ([place] [facility])    Esophageal varices ([place])    Fatty liver    Hypercholesteremia    Hypertension    Hypothyroidism    Nonalcoholic steatohepatitis ([facility])    Thyroid disease     Past Surgical History:  Procedure Laterality Date   ABDOMINOPLASTY  2004   APPENDECTOMY     AUGMENTATION MAMMAPLASTY Left 2004   following mastectomy   BREAST LUMPECTOMY  2004 X 2   CATARACT EXTRACTION     right eye 02/04/21, left eye 02-2021   COLONOSCOPY     2005,2019,   ESOPHAGOGASTRODUODENOSCOPY (EGD) WITH ESOPHAGEAL DILATION     2020and 2021   IR PARACENTESIS  04/19/2019   MASTECTOMY, PARTIAL Left 2012   ROBOTIC ASSISTED SALPINGO OOPHERECTOMY N/A 06/08/2021   Procedure: XI ROBOTIC ASSISTED  BILATERAL SALPINGO OOPHORECTOMY;  Surgeon: Lafonda Mosses, MD;  Location: [facility];  Service: Gynecology;  Laterality: N/A;    Social History   Socioeconomic History   Marital status: Divorced    Spouse name: Not on file   Number of children: 1   Years of education: Not on file   Highest education level: Not on file  Occupational History   Not on file  Tobacco Use   Smoking status: Former    Packs/day: 0.50    Years: 25.00    Total pack years: 12.50    Types: Cigarettes    Quit date: 02/29/1992    Years since quitting: 30.8   Smokeless tobacco: Never  Vaping Use   Vaping Use: Never used  Substance and Sexual Activity   Alcohol use: Not Currently    Comment: quit EtOH use in 2020   Drug use: No   Sexual activity: Not Currently    Partners: Male    Birth control/protection: Post-menopausal  Other Topics Concern   Not on file  Social History Narrative   Not on file   Social Determinants of Health   Financial Resource Strain: Not on file  Food Insecurity: Not on file  Transportation Needs: Not on file  Physical Activity: Not on file  Stress: Not on file  Social Connections: Not on file     FAMILY HISTORY:  We obtained a detailed,  4-generation family history.  Significant diagnoses are listed below: Family History  Problem Relation Age of Onset   COPD Father    Lung cancer Maternal Aunt        does not smoke   Bone cancer Maternal Uncle    Ovarian cancer Maternal Grandmother 26 - 8   Ovarian cancer Cousin        maternal first cousin   Cancer Cousin        paternal first cousin, unknown type   Breast cancer Niece 27   Other Niece        Genetic testing- VUS in BARD1   Rectal cancer Niece 49   Colon cancer Neg Hx    Pancreatic cancer Neg Hx    Prostate cancer Neg Hx    Joyce Griffin niece was diagnosed with breast cancer at age 64, she died at 74. This niece's sister reportedly had genetic testing and a variant of uncertain significance was detected in  [place]. A different niece was recently diagnosed with rectal cancer at age 74. Joyce Griffin maternal aunt was diagnosed with lung cancer, she did not smoke and is deceased. Her maternal uncle was diagnosed with bone cancer, he is deceased. She has a maternal first cousin who died due to ovarian cancer at an unknown age. Joyce Griffin maternal grandmother was diagnosed with ovarian cancer in her 44s, she is deceased. Joyce Griffin has two paternal first cousins who had a history of cancer (unknown type) they are both deceased. There is no reported Ashkenazi Jewish ancestry.   GENETIC COUNSELING ASSESSMENT: Joyce Griffin is a 72 y.o. female with a personal and family history of cancer which is somewhat suggestive of a hereditary predisposition to cancer given her young age at diagnosis and family history of breast and ovarian cancer. We, therefore, discussed and recommended the following at today's visit.   DISCUSSION: We discussed that 5 - 10% of cancer is hereditary, with most cases of breast cancer associated with BRCA1/2.  There are other genes that can be associated with hereditary breast cancer syndromes.  We discussed that testing is beneficial for several reasons including knowing how to follow individuals after completing their treatment and understanding if other family members could be at an increased risk for cancer.   We reviewed the characteristics, features and inheritance patterns of hereditary cancer syndromes. We also discussed genetic testing, including the appropriate family members to test, the process of testing, insurance coverage and turn-around-time for results. We discussed the implications of a negative, positive, carrier and/or variant of uncertain significant result.    Based on Joyce Griffin's personal and family history of cancer, she meets medical criteria for genetic testing. Joyce Griffin  was offered a common hereditary cancer panel (36 genes) and an expanded pan-cancer panel (77 genes).  Joyce Griffin was informed of the benefits and limitations of each panel, including that expanded pan-cancer panels contain genes that do not have clear management guidelines at this point in time.  We also discussed that as the number of genes included on a panel increases, the chances of variants of uncertain significance increases. After considering the benefits and limitations of each gene panel, Joyce Griffin elected to have [employer].  The CancerNext gene panel offered by [employer] includes sequencing, rearrangement analysis, and RNA analysis for the following 36 genes:   APC, ATM, AXIN2, BARD1, BMPR1A, BRCA1, BRCA2, BRIP1, CDH1, CDK4, CDKN2A, CHEK2, DICER1, HOXB13, EPCAM, GREM1, MLH1, MSH2, MSH3, MSH6, MUTYH, NBN, NF1, NTHL1, PALB2,  PMS2, POLD1, POLE, PTEN, RAD51C, RAD51D, RECQL, SMAD4, SMARCA4, STK11, and TP53.   PLAN: After considering the risks, benefits, and limitations, Joyce Griffin provided informed consent to pursue genetic testing and the blood sample was sent to [facility] for analysis of the CancerNext Panel. Results should be available within approximately 2-3 weeks' time, at which point they will be disclosed by telephone to Joyce Griffin, as will any additional recommendations warranted by these results. Joyce Griffin will receive a summary of her genetic counseling visit and a copy of her results once available. This information will also be available in Epic.   Joyce Griffin questions were answered to her satisfaction today. Our contact information was provided should additional questions or concerns arise. Thank you for the referral and allowing [place] to share in the care of your patient.   Lucille Passy, MS, [facility] Genetic Counselor [email or web address] (P) [phone]  The patient was seen for a total of 40 minutes in face-to-face genetic counseling. The patient was seen alone.  Drs. Lindi Adie and/or Burr Medico were available to discuss this case as needed.    _______________________________________________________________________ For Office Staff:  Number of people involved in session: 1 Was an Intern/ student involved with case: no

## 2023-01-03 ENCOUNTER — Encounter: Payer: Self-pay | Admitting: Genetic Counselor

## 2023-01-03 ENCOUNTER — Telehealth: Payer: Self-pay | Admitting: Genetic Counselor

## 2023-01-03 DIAGNOSIS — Z1379 Encounter for other screening for genetic and chromosomal anomalies: Secondary | ICD-10-CM | POA: Insufficient documentation

## 2023-01-03 NOTE — Telephone Encounter (Signed)
I contacted Ms. Picchi to discuss her genetic testing results. No pathogenic variants were identified in the 36 genes analyzed. Detailed clinic note to follow.  The test report has been scanned into EPIC and is located under the Molecular Pathology section of the Results Review tab.  A portion of the result report is included below for reference.   Lucille Passy, MS, [facility] Genetic Counselor [email or web address] (P) [phone]

## 2023-01-04 ENCOUNTER — Encounter: Payer: Self-pay | Admitting: Genetic Counselor

## 2023-01-14 ENCOUNTER — Ambulatory Visit: Payer: Self-pay | Admitting: Genetic Counselor

## 2023-01-14 DIAGNOSIS — Z1379 Encounter for other screening for genetic and chromosomal anomalies: Secondary | ICD-10-CM

## 2023-01-14 NOTE — Progress Notes (Signed)
HPI:   Joyce Griffin. Joyce Griffin was previously seen in the [facility] clinic due to a personal and family history of cancer and concerns regarding a hereditary predisposition to cancer. Please refer to our prior cancer genetics clinic note for more information regarding our discussion, assessment and recommendations, at the time. Joyce Griffin. Joyce recent genetic test results were disclosed to her, as were recommendations warranted by these results. These results and recommendations are discussed in more detail below.  CANCER HISTORY:  Oncology History   No history exists.    FAMILY HISTORY:  We obtained a detailed, 4-generation family history.  Significant diagnoses are listed below:      Family History  Problem Relation Age of Onset   COPD Father     Lung cancer Maternal Aunt          does not smoke   Bone cancer Maternal Uncle     Ovarian cancer Maternal Grandmother 60 - 68   Ovarian cancer Cousin          maternal first cousin   Cancer Cousin          paternal first cousin, unknown type   Breast cancer Niece 29   Other Niece          Genetic testing- VUS in BARD1   Rectal cancer Niece 32   Colon cancer Neg Hx     Pancreatic cancer Neg Hx     Prostate cancer Neg Hx        Joyce Griffin. Griffin's niece was diagnosed with breast cancer at age 30, she died at 77. This niece's sister reportedly had genetic testing and a variant of uncertain significance was detected in Joyce Griffin. A different niece was recently diagnosed with rectal cancer at age 61. Joyce Griffin. Joyce Griffin maternal aunt was diagnosed with lung cancer, she did not smoke and is deceased. Her maternal uncle was diagnosed with bone cancer, he is deceased. She has a maternal first cousin who died due to ovarian cancer at an unknown age. Joyce Griffin. Joyce Griffin maternal grandmother was diagnosed with ovarian cancer in her 98s, she is deceased. Joyce Griffin. Joyce Griffin has two paternal first cousins who had a history of cancer (unknown type) they are both deceased. There is no  reported Ashkenazi Jewish ancestry.   GENETIC TEST RESULTS:  The [facility] CancerNext Panel found no pathogenic mutations.  The CancerNext gene panel offered by [employer] includes sequencing, rearrangement analysis, and RNA analysis for the following 36 genes:   APC, ATM, AXIN2, BARD1, BMPR1A, BRCA1, BRCA2, BRIP1, CDH1, CDK4, CDKN2A, CHEK2, DICER1, HOXB13, EPCAM, GREM1, MLH1, MSH2, MSH3, MSH6, MUTYH, NBN, NF1, NTHL1, PALB2, PMS2, POLD1, POLE, PTEN, RAD51C, RAD51D, RECQL, SMAD4, SMARCA4, STK11, and TP53.   The test report has been scanned into EPIC and is located under the Molecular Pathology section of the Results Review tab.  A portion of the result report is included below for reference. Genetic testing reported out on 12/29/2022.        Even though a pathogenic variant was not identified, possible explanations for the cancer in the family may include: There may be no hereditary risk for cancer in the family. The cancers in Joyce Griffin. Joyce Griffin and/or her family may be due to other genetic or environmental factors. There may be a gene mutation in one of these genes that current testing methods cannot detect, but that chance is small. There could be another gene that has not yet been discovered, or that we have not yet tested, that is responsible for the  cancer diagnoses in the family.  It is also possible there is a hereditary cause for the cancer in the family that Joyce Griffin. Griffin did not inherit.  Therefore, it is important to remain in touch with cancer genetics in the future so that we can continue to offer Joyce Griffin. Griffin the most up to date genetic testing.   ADDITIONAL GENETIC TESTING:  We discussed with Joyce Griffin. Joyce Griffin that her genetic testing was fairly extensive.  If there are genes identified to increase cancer risk that can be analyzed in the future, we would be happy to discuss and coordinate this testing at that time.    CANCER SCREENING RECOMMENDATIONS:  Joyce Griffin. Griffin test result is considered  negative (normal).  This means that we have not identified a hereditary cause for her personal and family history of cancer at this time.   An individual's cancer risk and medical management are not determined by genetic test results alone. Overall cancer risk assessment incorporates additional factors, including personal medical history, family history, and any available genetic information that may result in a personalized plan for cancer prevention and surveillance. Therefore, it is recommended she continue to follow the cancer management and screening guidelines provided by her oncology and primary healthcare provider.  RECOMMENDATIONS FOR FAMILY MEMBERS:   Individuals in this family might be at some increased risk of developing cancer, over the general population risk, due to the family history of cancer. We recommend women in this family have a yearly mammogram beginning at age 22, or 26 years younger than the earliest onset of cancer, an annual clinical breast exam, and perform monthly breast self-exams. Other members of the family may still carry a pathogenic variant in one of these genes that Joyce Griffin. Joyce Griffin did not inherit. Based on the family history, we recommend her siblings have genetic counseling and testing.   FOLLOW-UP:  Cancer genetics is a rapidly advancing field and it is possible that new genetic tests will be appropriate for her and/or her family members in the future. We encouraged her to remain in contact with cancer genetics on an annual basis so we can update her personal and family histories and let her know of advances in cancer genetics that may benefit this family.   Our contact number was provided. Joyce Griffin. Joyce Griffin questions were answered to her satisfaction, and she knows she is welcome to call us at anytime with additional questions or concerns.   Joyce Griffin, Joyce Griffin, [email or web address] (P) [phone]

## 2023-02-03 ENCOUNTER — Other Ambulatory Visit: Payer: Self-pay | Admitting: Internal Medicine

## 2023-02-03 DIAGNOSIS — Z1231 Encounter for screening mammogram for malignant neoplasm of breast: Secondary | ICD-10-CM

## 2023-02-22 DIAGNOSIS — Z23 Encounter for immunization: Secondary | ICD-10-CM | POA: Diagnosis not present

## 2023-03-21 ENCOUNTER — Other Ambulatory Visit: Payer: Self-pay | Admitting: Internal Medicine

## 2023-03-21 ENCOUNTER — Ambulatory Visit
Admission: RE | Admit: 2023-03-21 | Discharge: 2023-03-21 | Disposition: A | Discharge status: Home / Self Care | Payer: Medicare Other | Source: Ambulatory Visit | Attending: Internal Medicine | Admitting: Internal Medicine

## 2023-03-21 DIAGNOSIS — Z1231 Encounter for screening mammogram for malignant neoplasm of breast: Secondary | ICD-10-CM | POA: Diagnosis not present

## 2023-03-24 ENCOUNTER — Ambulatory Visit
Admission: RE | Admit: 2023-03-24 | Discharge: 2023-03-24 | Disposition: A | Discharge status: Home / Self Care | Payer: Medicare Other | Source: Ambulatory Visit | Attending: Internal Medicine | Admitting: Internal Medicine

## 2023-03-24 ENCOUNTER — Other Ambulatory Visit: Payer: Self-pay | Admitting: Internal Medicine

## 2023-03-24 DIAGNOSIS — M25552 Pain in left hip: Secondary | ICD-10-CM | POA: Diagnosis not present

## 2023-04-11 ENCOUNTER — Other Ambulatory Visit: Payer: Self-pay | Admitting: Gastroenterology

## 2023-04-11 DIAGNOSIS — K7030 Alcoholic cirrhosis of liver without ascites: Secondary | ICD-10-CM

## 2023-05-05 ENCOUNTER — Ambulatory Visit
Admission: RE | Admit: 2023-05-05 | Discharge: 2023-05-05 | Disposition: A | Discharge status: Home / Self Care | Payer: Medicare Other | Source: Ambulatory Visit | Attending: Gastroenterology | Admitting: Gastroenterology

## 2023-05-05 DIAGNOSIS — R161 Splenomegaly, not elsewhere classified: Secondary | ICD-10-CM | POA: Diagnosis not present

## 2023-05-05 DIAGNOSIS — K7030 Alcoholic cirrhosis of liver without ascites: Secondary | ICD-10-CM

## 2023-05-05 DIAGNOSIS — K7460 Unspecified cirrhosis of liver: Secondary | ICD-10-CM | POA: Diagnosis not present

## 2023-05-05 DIAGNOSIS — K8020 Calculus of gallbladder without cholecystitis without obstruction: Secondary | ICD-10-CM | POA: Diagnosis not present

## 2023-05-06 ENCOUNTER — Other Ambulatory Visit: Payer: Medicare Other

## 2023-05-17 DIAGNOSIS — Z9189 Other specified personal risk factors, not elsewhere classified: Secondary | ICD-10-CM | POA: Diagnosis not present

## 2023-05-17 DIAGNOSIS — N895 Stricture and atresia of vagina: Secondary | ICD-10-CM | POA: Diagnosis not present

## 2023-05-17 DIAGNOSIS — Z853 Personal history of malignant neoplasm of breast: Secondary | ICD-10-CM | POA: Diagnosis not present

## 2023-06-24 DIAGNOSIS — K7460 Unspecified cirrhosis of liver: Secondary | ICD-10-CM | POA: Diagnosis not present

## 2023-06-24 DIAGNOSIS — K7031 Alcoholic cirrhosis of liver with ascites: Secondary | ICD-10-CM | POA: Diagnosis not present

## 2023-06-24 DIAGNOSIS — I8510 Secondary esophageal varices without bleeding: Secondary | ICD-10-CM | POA: Diagnosis not present

## 2023-06-24 DIAGNOSIS — K7469 Other cirrhosis of liver: Secondary | ICD-10-CM | POA: Diagnosis not present

## 2023-06-24 DIAGNOSIS — K7581 Nonalcoholic steatohepatitis (NASH): Secondary | ICD-10-CM | POA: Diagnosis not present

## 2023-06-24 DIAGNOSIS — Z1159 Encounter for screening for other viral diseases: Secondary | ICD-10-CM | POA: Diagnosis not present

## 2023-06-24 DIAGNOSIS — R600 Localized edema: Secondary | ICD-10-CM | POA: Diagnosis not present

## 2023-07-07 DIAGNOSIS — L989 Disorder of the skin and subcutaneous tissue, unspecified: Secondary | ICD-10-CM | POA: Diagnosis not present

## 2023-07-12 DIAGNOSIS — I788 Other diseases of capillaries: Secondary | ICD-10-CM | POA: Diagnosis not present

## 2023-07-12 DIAGNOSIS — L814 Other melanin hyperpigmentation: Secondary | ICD-10-CM | POA: Diagnosis not present

## 2023-08-22 DIAGNOSIS — M810 Age-related osteoporosis without current pathological fracture: Secondary | ICD-10-CM | POA: Diagnosis not present

## 2023-08-22 DIAGNOSIS — E7800 Pure hypercholesterolemia, unspecified: Secondary | ICD-10-CM | POA: Diagnosis not present

## 2023-08-22 DIAGNOSIS — Z0000 Encounter for general adult medical examination without abnormal findings: Secondary | ICD-10-CM | POA: Diagnosis not present

## 2023-08-22 DIAGNOSIS — Z5181 Encounter for therapeutic drug level monitoring: Secondary | ICD-10-CM | POA: Diagnosis not present

## 2023-08-22 DIAGNOSIS — I10 Essential (primary) hypertension: Secondary | ICD-10-CM | POA: Diagnosis not present

## 2023-08-22 DIAGNOSIS — K7460 Unspecified cirrhosis of liver: Secondary | ICD-10-CM | POA: Diagnosis not present

## 2023-08-22 DIAGNOSIS — E039 Hypothyroidism, unspecified: Secondary | ICD-10-CM | POA: Diagnosis not present

## 2023-08-22 DIAGNOSIS — Z1331 Encounter for screening for depression: Secondary | ICD-10-CM | POA: Diagnosis not present

## 2023-09-23 DIAGNOSIS — Z23 Encounter for immunization: Secondary | ICD-10-CM | POA: Diagnosis not present

## 2023-10-12 DIAGNOSIS — L814 Other melanin hyperpigmentation: Secondary | ICD-10-CM | POA: Diagnosis not present

## 2023-10-12 DIAGNOSIS — D225 Melanocytic nevi of trunk: Secondary | ICD-10-CM | POA: Diagnosis not present

## 2023-10-12 DIAGNOSIS — L821 Other seborrheic keratosis: Secondary | ICD-10-CM | POA: Diagnosis not present

## 2023-10-21 ENCOUNTER — Other Ambulatory Visit: Payer: Self-pay | Admitting: Gastroenterology

## 2023-10-21 DIAGNOSIS — K7030 Alcoholic cirrhosis of liver without ascites: Secondary | ICD-10-CM

## 2023-10-26 ENCOUNTER — Ambulatory Visit
Admission: RE | Admit: 2023-10-26 | Discharge: 2023-10-26 | Disposition: A | Discharge status: Home / Self Care | Payer: Medicare Other | Source: Ambulatory Visit | Attending: Gastroenterology | Admitting: Gastroenterology

## 2023-10-26 DIAGNOSIS — K7030 Alcoholic cirrhosis of liver without ascites: Secondary | ICD-10-CM

## 2023-10-26 DIAGNOSIS — K7460 Unspecified cirrhosis of liver: Secondary | ICD-10-CM | POA: Diagnosis not present

## 2023-10-26 DIAGNOSIS — K8020 Calculus of gallbladder without cholecystitis without obstruction: Secondary | ICD-10-CM | POA: Diagnosis not present

## 2023-12-28 DIAGNOSIS — I8510 Secondary esophageal varices without bleeding: Secondary | ICD-10-CM | POA: Diagnosis not present

## 2023-12-28 DIAGNOSIS — K766 Portal hypertension: Secondary | ICD-10-CM | POA: Diagnosis not present

## 2023-12-28 DIAGNOSIS — F1011 Alcohol abuse, in remission: Secondary | ICD-10-CM | POA: Diagnosis not present

## 2023-12-28 DIAGNOSIS — K7469 Other cirrhosis of liver: Secondary | ICD-10-CM | POA: Diagnosis not present

## 2023-12-28 DIAGNOSIS — Z1159 Encounter for screening for other viral diseases: Secondary | ICD-10-CM | POA: Diagnosis not present

## 2023-12-29 DIAGNOSIS — F1011 Alcohol abuse, in remission: Secondary | ICD-10-CM | POA: Diagnosis not present

## 2023-12-29 DIAGNOSIS — Z1159 Encounter for screening for other viral diseases: Secondary | ICD-10-CM | POA: Diagnosis not present

## 2023-12-29 DIAGNOSIS — K7469 Other cirrhosis of liver: Secondary | ICD-10-CM | POA: Diagnosis not present

## 2024-01-12 DIAGNOSIS — K7030 Alcoholic cirrhosis of liver without ascites: Secondary | ICD-10-CM | POA: Diagnosis not present

## 2024-02-16 ENCOUNTER — Other Ambulatory Visit: Payer: Self-pay | Admitting: Internal Medicine

## 2024-02-16 DIAGNOSIS — Z1231 Encounter for screening mammogram for malignant neoplasm of breast: Secondary | ICD-10-CM

## 2024-03-12 DIAGNOSIS — E7800 Pure hypercholesterolemia, unspecified: Secondary | ICD-10-CM | POA: Diagnosis not present

## 2024-03-12 DIAGNOSIS — E039 Hypothyroidism, unspecified: Secondary | ICD-10-CM | POA: Diagnosis not present

## 2024-03-12 DIAGNOSIS — M810 Age-related osteoporosis without current pathological fracture: Secondary | ICD-10-CM | POA: Diagnosis not present

## 2024-03-12 DIAGNOSIS — I10 Essential (primary) hypertension: Secondary | ICD-10-CM | POA: Diagnosis not present

## 2024-03-22 ENCOUNTER — Ambulatory Visit
Admission: RE | Admit: 2024-03-22 | Discharge: 2024-03-22 | Disposition: A | Discharge status: Home / Self Care | Source: Ambulatory Visit | Attending: Internal Medicine | Admitting: Internal Medicine

## 2024-03-22 DIAGNOSIS — Z1231 Encounter for screening mammogram for malignant neoplasm of breast: Secondary | ICD-10-CM

## 2024-04-02 DIAGNOSIS — M25551 Pain in right hip: Secondary | ICD-10-CM | POA: Diagnosis not present

## 2024-04-05 DIAGNOSIS — Z961 Presence of intraocular lens: Secondary | ICD-10-CM | POA: Diagnosis not present

## 2024-04-05 DIAGNOSIS — Z9889 Other specified postprocedural states: Secondary | ICD-10-CM | POA: Diagnosis not present

## 2024-04-10 ENCOUNTER — Other Ambulatory Visit: Payer: Self-pay | Admitting: Gastroenterology

## 2024-04-10 DIAGNOSIS — K7460 Unspecified cirrhosis of liver: Secondary | ICD-10-CM

## 2024-04-11 DIAGNOSIS — I10 Essential (primary) hypertension: Secondary | ICD-10-CM | POA: Diagnosis not present

## 2024-04-11 DIAGNOSIS — E039 Hypothyroidism, unspecified: Secondary | ICD-10-CM | POA: Diagnosis not present

## 2024-04-11 DIAGNOSIS — M810 Age-related osteoporosis without current pathological fracture: Secondary | ICD-10-CM | POA: Diagnosis not present

## 2024-04-11 DIAGNOSIS — E7800 Pure hypercholesterolemia, unspecified: Secondary | ICD-10-CM | POA: Diagnosis not present

## 2024-04-17 ENCOUNTER — Ambulatory Visit
Admission: RE | Admit: 2024-04-17 | Discharge: 2024-04-17 | Disposition: A | Discharge status: Home / Self Care | Source: Ambulatory Visit | Attending: Gastroenterology | Admitting: Gastroenterology

## 2024-04-17 DIAGNOSIS — K769 Liver disease, unspecified: Secondary | ICD-10-CM | POA: Diagnosis not present

## 2024-04-17 DIAGNOSIS — K8020 Calculus of gallbladder without cholecystitis without obstruction: Secondary | ICD-10-CM | POA: Diagnosis not present

## 2024-04-17 DIAGNOSIS — R161 Splenomegaly, not elsewhere classified: Secondary | ICD-10-CM | POA: Diagnosis not present

## 2024-04-17 DIAGNOSIS — N200 Calculus of kidney: Secondary | ICD-10-CM | POA: Diagnosis not present

## 2024-04-17 DIAGNOSIS — K7460 Unspecified cirrhosis of liver: Secondary | ICD-10-CM

## 2024-04-25 DIAGNOSIS — M25551 Pain in right hip: Secondary | ICD-10-CM | POA: Diagnosis not present

## 2024-05-12 DIAGNOSIS — E039 Hypothyroidism, unspecified: Secondary | ICD-10-CM | POA: Diagnosis not present

## 2024-05-12 DIAGNOSIS — I10 Essential (primary) hypertension: Secondary | ICD-10-CM | POA: Diagnosis not present

## 2024-05-12 DIAGNOSIS — E7800 Pure hypercholesterolemia, unspecified: Secondary | ICD-10-CM | POA: Diagnosis not present

## 2024-05-12 DIAGNOSIS — M810 Age-related osteoporosis without current pathological fracture: Secondary | ICD-10-CM | POA: Diagnosis not present

## 2024-06-11 DIAGNOSIS — E7800 Pure hypercholesterolemia, unspecified: Secondary | ICD-10-CM | POA: Diagnosis not present

## 2024-06-11 DIAGNOSIS — M810 Age-related osteoporosis without current pathological fracture: Secondary | ICD-10-CM | POA: Diagnosis not present

## 2024-06-11 DIAGNOSIS — E039 Hypothyroidism, unspecified: Secondary | ICD-10-CM | POA: Diagnosis not present

## 2024-06-11 DIAGNOSIS — I10 Essential (primary) hypertension: Secondary | ICD-10-CM | POA: Diagnosis not present

## 2024-06-13 DIAGNOSIS — Z8639 Personal history of other endocrine, nutritional and metabolic disease: Secondary | ICD-10-CM | POA: Diagnosis not present

## 2024-06-13 DIAGNOSIS — F1011 Alcohol abuse, in remission: Secondary | ICD-10-CM | POA: Diagnosis not present

## 2024-06-13 DIAGNOSIS — I8510 Secondary esophageal varices without bleeding: Secondary | ICD-10-CM | POA: Diagnosis not present

## 2024-06-13 DIAGNOSIS — K7031 Alcoholic cirrhosis of liver with ascites: Secondary | ICD-10-CM | POA: Diagnosis not present

## 2024-06-13 DIAGNOSIS — K766 Portal hypertension: Secondary | ICD-10-CM | POA: Diagnosis not present

## 2024-06-13 DIAGNOSIS — R600 Localized edema: Secondary | ICD-10-CM | POA: Diagnosis not present

## 2024-06-20 DIAGNOSIS — K7469 Other cirrhosis of liver: Secondary | ICD-10-CM | POA: Diagnosis not present

## 2024-06-20 DIAGNOSIS — Z8639 Personal history of other endocrine, nutritional and metabolic disease: Secondary | ICD-10-CM | POA: Diagnosis not present

## 2024-06-20 DIAGNOSIS — F1011 Alcohol abuse, in remission: Secondary | ICD-10-CM | POA: Diagnosis not present

## 2024-07-12 DIAGNOSIS — I10 Essential (primary) hypertension: Secondary | ICD-10-CM | POA: Diagnosis not present

## 2024-07-12 DIAGNOSIS — M810 Age-related osteoporosis without current pathological fracture: Secondary | ICD-10-CM | POA: Diagnosis not present

## 2024-07-12 DIAGNOSIS — E039 Hypothyroidism, unspecified: Secondary | ICD-10-CM | POA: Diagnosis not present

## 2024-07-12 DIAGNOSIS — E7800 Pure hypercholesterolemia, unspecified: Secondary | ICD-10-CM | POA: Diagnosis not present

## 2024-07-19 DIAGNOSIS — N952 Postmenopausal atrophic vaginitis: Secondary | ICD-10-CM | POA: Diagnosis not present

## 2024-07-19 DIAGNOSIS — Z9189 Other specified personal risk factors, not elsewhere classified: Secondary | ICD-10-CM | POA: Diagnosis not present

## 2024-07-19 DIAGNOSIS — Z853 Personal history of malignant neoplasm of breast: Secondary | ICD-10-CM | POA: Diagnosis not present

## 2024-08-08 DIAGNOSIS — K7030 Alcoholic cirrhosis of liver without ascites: Secondary | ICD-10-CM | POA: Diagnosis not present

## 2024-08-12 DIAGNOSIS — M810 Age-related osteoporosis without current pathological fracture: Secondary | ICD-10-CM | POA: Diagnosis not present

## 2024-08-12 DIAGNOSIS — I10 Essential (primary) hypertension: Secondary | ICD-10-CM | POA: Diagnosis not present

## 2024-08-12 DIAGNOSIS — E039 Hypothyroidism, unspecified: Secondary | ICD-10-CM | POA: Diagnosis not present

## 2024-08-12 DIAGNOSIS — E7800 Pure hypercholesterolemia, unspecified: Secondary | ICD-10-CM | POA: Diagnosis not present

## 2024-08-30 DIAGNOSIS — Z5181 Encounter for therapeutic drug level monitoring: Secondary | ICD-10-CM | POA: Diagnosis not present

## 2024-08-30 DIAGNOSIS — E7800 Pure hypercholesterolemia, unspecified: Secondary | ICD-10-CM | POA: Diagnosis not present

## 2024-08-30 DIAGNOSIS — Z0000 Encounter for general adult medical examination without abnormal findings: Secondary | ICD-10-CM | POA: Diagnosis not present

## 2024-08-30 DIAGNOSIS — Z1331 Encounter for screening for depression: Secondary | ICD-10-CM | POA: Diagnosis not present

## 2024-08-30 DIAGNOSIS — K7460 Unspecified cirrhosis of liver: Secondary | ICD-10-CM | POA: Diagnosis not present

## 2024-08-30 DIAGNOSIS — I10 Essential (primary) hypertension: Secondary | ICD-10-CM | POA: Diagnosis not present

## 2024-08-30 DIAGNOSIS — M810 Age-related osteoporosis without current pathological fracture: Secondary | ICD-10-CM | POA: Diagnosis not present

## 2024-08-30 DIAGNOSIS — Z23 Encounter for immunization: Secondary | ICD-10-CM | POA: Diagnosis not present

## 2024-08-30 DIAGNOSIS — E039 Hypothyroidism, unspecified: Secondary | ICD-10-CM | POA: Diagnosis not present

## 2024-08-31 ENCOUNTER — Other Ambulatory Visit (HOSPITAL_BASED_OUTPATIENT_CLINIC_OR_DEPARTMENT_OTHER): Payer: Self-pay | Admitting: Internal Medicine

## 2024-08-31 DIAGNOSIS — M810 Age-related osteoporosis without current pathological fracture: Secondary | ICD-10-CM

## 2024-09-12 ENCOUNTER — Other Ambulatory Visit (HOSPITAL_BASED_OUTPATIENT_CLINIC_OR_DEPARTMENT_OTHER): Payer: Self-pay | Admitting: Physician Assistant

## 2024-09-12 DIAGNOSIS — K7469 Other cirrhosis of liver: Secondary | ICD-10-CM

## 2024-09-19 ENCOUNTER — Ambulatory Visit (HOSPITAL_BASED_OUTPATIENT_CLINIC_OR_DEPARTMENT_OTHER)
Admission: RE | Admit: 2024-09-19 | Discharge: 2024-09-19 | Disposition: A | Discharge status: Home / Self Care | Source: Ambulatory Visit | Attending: Physician Assistant | Admitting: Physician Assistant

## 2024-09-19 DIAGNOSIS — K7460 Unspecified cirrhosis of liver: Secondary | ICD-10-CM | POA: Diagnosis not present

## 2024-09-19 DIAGNOSIS — K7689 Other specified diseases of liver: Secondary | ICD-10-CM | POA: Diagnosis not present

## 2024-09-19 DIAGNOSIS — K7469 Other cirrhosis of liver: Secondary | ICD-10-CM | POA: Diagnosis not present

## 2024-09-19 DIAGNOSIS — K8020 Calculus of gallbladder without cholecystitis without obstruction: Secondary | ICD-10-CM | POA: Diagnosis not present

## 2024-10-09 ENCOUNTER — Other Ambulatory Visit: Payer: Self-pay | Admitting: Gastroenterology

## 2024-10-09 DIAGNOSIS — K7460 Unspecified cirrhosis of liver: Secondary | ICD-10-CM

## 2024-10-15 DIAGNOSIS — D225 Melanocytic nevi of trunk: Secondary | ICD-10-CM | POA: Diagnosis not present

## 2024-10-15 DIAGNOSIS — L821 Other seborrheic keratosis: Secondary | ICD-10-CM | POA: Diagnosis not present

## 2024-10-15 DIAGNOSIS — L814 Other melanin hyperpigmentation: Secondary | ICD-10-CM | POA: Diagnosis not present

## 2024-10-31 ENCOUNTER — Other Ambulatory Visit

## 2024-12-21 ENCOUNTER — Encounter: Payer: Self-pay | Admitting: *Deleted

## 2024-12-21 ENCOUNTER — Other Ambulatory Visit (HOSPITAL_COMMUNITY): Payer: Self-pay | Admitting: Gastroenterology

## 2024-12-21 DIAGNOSIS — K743 Primary biliary cirrhosis: Secondary | ICD-10-CM

## 2024-12-25 ENCOUNTER — Ambulatory Visit (HOSPITAL_BASED_OUTPATIENT_CLINIC_OR_DEPARTMENT_OTHER)
Admission: RE | Admit: 2024-12-25 | Discharge: 2024-12-25 | Disposition: A | Discharge status: Home / Self Care | Source: Ambulatory Visit | Attending: Gastroenterology | Admitting: Gastroenterology

## 2024-12-25 DIAGNOSIS — K743 Primary biliary cirrhosis: Secondary | ICD-10-CM | POA: Diagnosis present
